# Patient Record
Sex: Female | Born: 1970 | Hispanic: Yes | Marital: Married | Smoking: Never smoker
Health system: Southern US, Community
[De-identification: ages and names within clinical notes are randomized; demographics above are authoritative.]

## PROBLEM LIST (undated history)

## (undated) DIAGNOSIS — E119 Type 2 diabetes mellitus without complications: Secondary | ICD-10-CM

## (undated) HISTORY — PX: APPENDECTOMY: SHX54

---

## 2014-01-13 ENCOUNTER — Inpatient Hospital Stay (HOSPITAL_COMMUNITY): Payer: Medicaid Other

## 2014-01-13 ENCOUNTER — Inpatient Hospital Stay (HOSPITAL_COMMUNITY)
Admission: EM | Admit: 2014-01-13 | Discharge: 2014-01-16 | DRG: 871 | Disposition: A | Discharge status: Home / Self Care | Payer: Medicaid Other | Attending: Internal Medicine | Admitting: Internal Medicine

## 2014-01-13 ENCOUNTER — Other Ambulatory Visit: Payer: Self-pay

## 2014-01-13 ENCOUNTER — Encounter (HOSPITAL_COMMUNITY): Payer: Self-pay | Admitting: Emergency Medicine

## 2014-01-13 ENCOUNTER — Emergency Department (HOSPITAL_COMMUNITY): Payer: Medicaid Other

## 2014-01-13 DIAGNOSIS — Z91199 Patient's noncompliance with other medical treatment and regimen due to unspecified reason: Secondary | ICD-10-CM

## 2014-01-13 DIAGNOSIS — R7881 Bacteremia: Secondary | ICD-10-CM

## 2014-01-13 DIAGNOSIS — D72829 Elevated white blood cell count, unspecified: Secondary | ICD-10-CM | POA: Diagnosis present

## 2014-01-13 DIAGNOSIS — E1110 Type 2 diabetes mellitus with ketoacidosis without coma: Secondary | ICD-10-CM

## 2014-01-13 DIAGNOSIS — E871 Hypo-osmolality and hyponatremia: Secondary | ICD-10-CM | POA: Diagnosis present

## 2014-01-13 DIAGNOSIS — N10 Acute tubulo-interstitial nephritis: Secondary | ICD-10-CM | POA: Diagnosis present

## 2014-01-13 DIAGNOSIS — R6520 Severe sepsis without septic shock: Secondary | ICD-10-CM | POA: Diagnosis present

## 2014-01-13 DIAGNOSIS — N939 Abnormal uterine and vaginal bleeding, unspecified: Secondary | ICD-10-CM | POA: Diagnosis present

## 2014-01-13 DIAGNOSIS — A4151 Sepsis due to Escherichia coli [E. coli]: Secondary | ICD-10-CM

## 2014-01-13 DIAGNOSIS — E876 Hypokalemia: Secondary | ICD-10-CM | POA: Diagnosis present

## 2014-01-13 DIAGNOSIS — E1010 Type 1 diabetes mellitus with ketoacidosis without coma: Secondary | ICD-10-CM | POA: Diagnosis present

## 2014-01-13 DIAGNOSIS — N926 Irregular menstruation, unspecified: Secondary | ICD-10-CM | POA: Diagnosis present

## 2014-01-13 DIAGNOSIS — D649 Anemia, unspecified: Secondary | ICD-10-CM

## 2014-01-13 DIAGNOSIS — D509 Iron deficiency anemia, unspecified: Secondary | ICD-10-CM | POA: Diagnosis present

## 2014-01-13 DIAGNOSIS — R5383 Other fatigue: Secondary | ICD-10-CM | POA: Diagnosis not present

## 2014-01-13 DIAGNOSIS — Z9119 Patient's noncompliance with other medical treatment and regimen: Secondary | ICD-10-CM

## 2014-01-13 DIAGNOSIS — A419 Sepsis, unspecified organism: Secondary | ICD-10-CM | POA: Diagnosis present

## 2014-01-13 DIAGNOSIS — Z23 Encounter for immunization: Secondary | ICD-10-CM | POA: Diagnosis not present

## 2014-01-13 DIAGNOSIS — R5381 Other malaise: Secondary | ICD-10-CM | POA: Diagnosis present

## 2014-01-13 HISTORY — DX: Type 2 diabetes mellitus without complications: E11.9

## 2014-01-13 LAB — RETICULOCYTES
RBC.: 3.73 MIL/uL — ABNORMAL LOW (ref 3.87–5.11)
RETIC CT PCT: 2.2 % (ref 0.4–3.1)
Retic Count, Absolute: 82.1 10*3/uL (ref 19.0–186.0)

## 2014-01-13 LAB — I-STAT ARTERIAL BLOOD GAS, ED
Acid-base deficit: 1 mmol/L (ref 0.0–2.0)
Bicarbonate: 22.1 mEq/L (ref 20.0–24.0)
O2 Saturation: 97 %
PCO2 ART: 30.7 mmHg — AB (ref 35.0–45.0)
PO2 ART: 92 mmHg (ref 80.0–100.0)
Patient temperature: 100.7
TCO2: 23 mmol/L (ref 0–100)
pH, Arterial: 7.47 — ABNORMAL HIGH (ref 7.350–7.450)

## 2014-01-13 LAB — HEPATIC FUNCTION PANEL
ALBUMIN: 2.7 g/dL — AB (ref 3.5–5.2)
ALT: 15 U/L (ref 0–35)
AST: 18 U/L (ref 0–37)
Alkaline Phosphatase: 99 U/L (ref 39–117)
Bilirubin, Direct: <0.2 mg/dL (ref 0.0–0.3)
Total Bilirubin: 0.3 mg/dL (ref 0.3–1.2)
Total Protein: 6.7 g/dL (ref 6.0–8.3)

## 2014-01-13 LAB — URINE MICROSCOPIC-ADD ON

## 2014-01-13 LAB — CBC
HCT: 22.4 % — ABNORMAL LOW (ref 36.0–46.0)
HEMATOCRIT: 25.6 % — AB (ref 36.0–46.0)
HEMOGLOBIN: 6 g/dL — AB (ref 12.0–15.0)
Hemoglobin: 6.8 g/dL — CL (ref 12.0–15.0)
MCH: 15.9 pg — ABNORMAL LOW (ref 26.0–34.0)
MCH: 16.1 pg — AB (ref 26.0–34.0)
MCHC: 26.6 g/dL — AB (ref 30.0–36.0)
MCHC: 26.8 g/dL — ABNORMAL LOW (ref 30.0–36.0)
MCV: 59.7 fL — AB (ref 78.0–100.0)
MCV: 60.1 fL — ABNORMAL LOW (ref 78.0–100.0)
Platelets: 341 10*3/uL (ref 150–400)
Platelets: 304 10*3/uL (ref 150–400)
RBC: 4.29 MIL/uL (ref 3.87–5.11)
RBC: 3.73 MIL/uL — ABNORMAL LOW (ref 3.87–5.11)
RDW: 19.5 % — ABNORMAL HIGH (ref 11.5–15.5)
RDW: 19.4 % — ABNORMAL HIGH (ref 11.5–15.5)
WBC: 9.1 10*3/uL (ref 4.0–10.5)
WBC: 8.3 10*3/uL (ref 4.0–10.5)

## 2014-01-13 LAB — PROTIME-INR
INR: 1.28 (ref 0.00–1.49)
Prothrombin Time: 16 seconds — ABNORMAL HIGH (ref 11.6–15.2)

## 2014-01-13 LAB — URINALYSIS, ROUTINE W REFLEX MICROSCOPIC
Bilirubin Urine: NEGATIVE
Ketones, ur: 15 mg/dL — AB
Nitrite: POSITIVE — AB
PH: 5.5 (ref 5.0–8.0)
Protein, ur: NEGATIVE mg/dL
Specific Gravity, Urine: 1.036 — ABNORMAL HIGH (ref 1.005–1.030)
Urobilinogen, UA: 1 mg/dL (ref 0.0–1.0)

## 2014-01-13 LAB — BASIC METABOLIC PANEL
BUN: 9 mg/dL (ref 6–23)
BUN: 7 mg/dL (ref 6–23)
CHLORIDE: 89 meq/L — AB (ref 96–112)
CHLORIDE: 99 meq/L (ref 96–112)
CO2: 18 mEq/L — ABNORMAL LOW (ref 19–32)
CO2: 19 meq/L (ref 19–32)
Calcium: 9.1 mg/dL (ref 8.4–10.5)
Calcium: 7.9 mg/dL — ABNORMAL LOW (ref 8.4–10.5)
Creatinine, Ser: 0.38 mg/dL — ABNORMAL LOW (ref 0.50–1.10)
Creatinine, Ser: 0.36 mg/dL — ABNORMAL LOW (ref 0.50–1.10)
GFR calc Af Amer: >90 mL/min (ref >90)
GFR calc Af Amer: >90 mL/min (ref >90)
GFR calc non Af Amer: >90 mL/min (ref >90)
GLUCOSE: 389 mg/dL — AB (ref 70–99)
Glucose, Bld: 291 mg/dL — ABNORMAL HIGH (ref 70–99)
Potassium: 4.2 mEq/L (ref 3.7–5.3)
Potassium: 3.4 mEq/L — ABNORMAL LOW (ref 3.7–5.3)
Sodium: 128 mEq/L — ABNORMAL LOW (ref 137–147)
Sodium: 134 mEq/L — ABNORMAL LOW (ref 137–147)

## 2014-01-13 LAB — CBG MONITORING, ED
GLUCOSE-CAPILLARY: 151 mg/dL — AB (ref 70–99)
Glucose-Capillary: 419 mg/dL — ABNORMAL HIGH (ref 70–99)
Glucose-Capillary: 289 mg/dL — ABNORMAL HIGH (ref 70–99)
Glucose-Capillary: 229 mg/dL — ABNORMAL HIGH (ref 70–99)
Glucose-Capillary: 143 mg/dL — ABNORMAL HIGH (ref 70–99)

## 2014-01-13 LAB — GLUCOSE, CAPILLARY
Glucose-Capillary: 177 mg/dL — ABNORMAL HIGH (ref 70–99)
Glucose-Capillary: 179 mg/dL — ABNORMAL HIGH (ref 70–99)
Glucose-Capillary: 189 mg/dL — ABNORMAL HIGH (ref 70–99)

## 2014-01-13 LAB — APTT: aPTT: 31 seconds (ref 24–37)

## 2014-01-13 LAB — IRON AND TIBC: UIBC: 277 ug/dL (ref 125–400)

## 2014-01-13 LAB — I-STAT TROPONIN, ED: Troponin i, poc: 0 ng/mL (ref 0.00–0.08)

## 2014-01-13 LAB — PREPARE RBC (CROSSMATCH)

## 2014-01-13 LAB — FERRITIN: Ferritin: 13 ng/mL (ref 10–291)

## 2014-01-13 LAB — HEMOGLOBIN A1C
Hgb A1c MFr Bld: 9.2 % — ABNORMAL HIGH (ref <5.7)
MEAN PLASMA GLUCOSE: 217 mg/dL — AB (ref <117)

## 2014-01-13 LAB — ABO/RH: ABO/RH(D): O POS

## 2014-01-13 LAB — FOLATE: FOLATE: 17.5 ng/mL

## 2014-01-13 LAB — VITAMIN B12: VITAMIN B 12: 884 pg/mL (ref 211–911)

## 2014-01-13 LAB — POC OCCULT BLOOD, ED: Fecal Occult Bld: NEGATIVE

## 2014-01-13 LAB — I-STAT CG4 LACTIC ACID, ED: Lactic Acid, Venous: 1.2 mmol/L (ref 0.5–2.2)

## 2014-01-13 MED ORDER — ACETAMINOPHEN 325 MG PO TABS
650.0000 mg | ORAL_TABLET | Freq: Once | ORAL | Status: AC
  Administered 2014-01-13: 650 mg via ORAL
  Filled 2014-01-13: qty 2

## 2014-01-13 MED ORDER — DEXTROSE 5 % IV SOLN
1.0000 g | Freq: Two times a day (BID) | INTRAVENOUS | Status: DC
  Administered 2014-01-14 – 2014-01-16 (×5): 1 g via INTRAVENOUS
  Filled 2014-01-13 (×6): qty 1

## 2014-01-13 MED ORDER — DEXTROSE-NACL 5-0.45 % IV SOLN
INTRAVENOUS | Status: DC
  Administered 2014-01-13 – 2014-01-14 (×2): via INTRAVENOUS

## 2014-01-13 MED ORDER — ACETAMINOPHEN 325 MG PO TABS
650.0000 mg | ORAL_TABLET | ORAL | Status: AC
  Administered 2014-01-13: 650 mg via ORAL
  Filled 2014-01-13: qty 2

## 2014-01-13 MED ORDER — SODIUM CHLORIDE 0.9 % IV SOLN
INTRAVENOUS | Status: DC
  Administered 2014-01-13: 2.3 [IU]/h via INTRAVENOUS
  Filled 2014-01-13: qty 1

## 2014-01-13 MED ORDER — ACETAMINOPHEN 325 MG PO TABS
650.0000 mg | ORAL_TABLET | Freq: Four times a day (QID) | ORAL | Status: DC | PRN
  Administered 2014-01-13: 650 mg via ORAL

## 2014-01-13 MED ORDER — GUAIFENESIN-CODEINE 100-10 MG/5ML PO SOLN
5.0000 mL | ORAL | Status: DC | PRN
  Administered 2014-01-15 (×2): 5 mL via ORAL
  Filled 2014-01-13 (×3): qty 5

## 2014-01-13 MED ORDER — DEXTROSE 50 % IV SOLN: 25.0000 mL | INTRAVENOUS | Status: DC | PRN

## 2014-01-13 MED ORDER — DEXTROSE 5 % IV SOLN
1.0000 g | INTRAVENOUS | Status: AC
  Administered 2014-01-13: 1 g via INTRAVENOUS
  Filled 2014-01-13: qty 1

## 2014-01-13 MED ORDER — ACETAMINOPHEN 325 MG PO TABS
ORAL_TABLET | ORAL | Status: AC
  Filled 2014-01-13: qty 2

## 2014-01-13 MED ORDER — SODIUM CHLORIDE 0.9 % IV SOLN
1250.0000 mg | INTRAVENOUS | Status: AC
  Administered 2014-01-13: 1250 mg via INTRAVENOUS
  Filled 2014-01-13: qty 1250

## 2014-01-13 MED ORDER — INSULIN ASPART 100 UNIT/ML ~~LOC~~ SOLN
10.0000 [IU] | Freq: Once | SUBCUTANEOUS | Status: AC
  Administered 2014-01-13: 10 [IU] via SUBCUTANEOUS
  Filled 2014-01-13: qty 1

## 2014-01-13 MED ORDER — VANCOMYCIN HCL IN DEXTROSE 750-5 MG/150ML-% IV SOLN
750.0000 mg | Freq: Two times a day (BID) | INTRAVENOUS | Status: DC
  Administered 2014-01-14 – 2014-01-15 (×3): 750 mg via INTRAVENOUS
  Filled 2014-01-13 (×3): qty 150

## 2014-01-13 MED ORDER — SODIUM CHLORIDE 0.9 % IV BOLUS (SEPSIS)
1000.0000 mL | Freq: Once | INTRAVENOUS | Status: AC
  Administered 2014-01-13: 1000 mL via INTRAVENOUS

## 2014-01-13 MED ORDER — POTASSIUM CHLORIDE 10 MEQ/100ML IV SOLN
10.0000 meq | INTRAVENOUS | Status: AC
  Administered 2014-01-13: 10 meq via INTRAVENOUS
  Filled 2014-01-13: qty 100

## 2014-01-13 MED ORDER — SODIUM CHLORIDE 0.9 % IV SOLN
INTRAVENOUS | Status: AC
  Administered 2014-01-13: 17:00:00 via INTRAVENOUS

## 2014-01-13 MED ORDER — SODIUM CHLORIDE 0.9 % IV BOLUS (SEPSIS)
500.0000 mL | Freq: Once | INTRAVENOUS | Status: AC
  Administered 2014-01-13: 500 mL via INTRAVENOUS

## 2014-01-13 MED ORDER — ACETAMINOPHEN 500 MG PO TABS: 1000.0000 mg | ORAL_TABLET | Freq: Four times a day (QID) | ORAL | Status: DC | PRN

## 2014-01-13 MED ORDER — SODIUM CHLORIDE 0.9 % IV SOLN
INTRAVENOUS | Status: DC
  Administered 2014-01-13: 19:00:00 via INTRAVENOUS

## 2014-01-13 NOTE — Progress Notes (Signed)
Pt admitted to room 3S03. T 102.9 BP 108/57  HR 105 RR 29 SpO2 98% on room air and CBG is 177. Pt is A&O X 4 and denies pain. Pt oriented to room and equipment. NP notified about fever. New orders received. Will carry out orders and continue to monitor closely.

## 2014-01-13 NOTE — ED Notes (Signed)
[facility] [place] [facility]  Daughter [phone]

## 2014-01-13 NOTE — ED Notes (Signed)
Critical Hemoglobin reported to AnetaHayley, [place]

## 2014-01-13 NOTE — ED Notes (Signed)
Dr. Susie CassetteAbrol at the bedside with patient.

## 2014-01-13 NOTE — ED Notes (Signed)
Pt reports for 2 weeks has been feeling weak, and cold. Has diabetes but has been checking sugars for 2 months. CBG 419 today. Denies n/v/d. Hasn't been eating very much. C/o generalized not feeling well all over her body. Chest pressure and left arm pain for several days. Denies cardiac hx .

## 2014-01-13 NOTE — Progress Notes (Signed)
Pt low grade fever earlier during 1st unit blood. No other sx of blood reaction. No rash, SOB, chest pain. RN gave tylenol and continued blood. After 1st unit in, RN paged back secondary to pt with fever [id number]. Pt has been dx with sepsis and has been pan cultured. Will give additional 650mg  Tylenol now and change to 1000mg  po q6h prn (max 4Gms in 24 Hrs). Still without other signs of blood reaction. Other VSS. Instructed RN to let Temp go below 100 F before starting 2nd unit.

## 2014-01-13 NOTE — ED Notes (Signed)
Spoke with Dr. Craige CottaKirby concerning pt temp. Telephone order received for tylenol and to restart blood back.

## 2014-01-13 NOTE — ED Notes (Signed)
Attempted report 

## 2014-01-13 NOTE — H&P (Addendum)
[facility] History and Physical  Nancy Guerra WUJ:[id number] DOB: 1971/03/04 DOA: 01/13/2014  Referring physician: ER  PCP: No primary provider on file.   Chief Complaint: Weakness  HPI:  43 year old female, diabetic presents to the ER because of generalized weakness and fatigue progressively worsening over the past 2 weeks.,and  cough for the last couple of days. The patient is a diabetic but has been noncompliant for the last 3-4 years with her oral hypoglycemics. She does not recall any fever at home but was found to have a fever of 100.7 in the ED. Initially tachycardic with a heart rate of 128, systolic blood pressure of 107. Yesterday she took Advil and started  sweating after that. The patient was found to be fairly anemic with a hemoglobin of 6.8. CBg was greater than 400. She also had one episode of chest pressure this morning, without any associated shortness of breath. She did complain of some lightheadedness and fatigue which has been progressively getting worse over the last 2 weeks. Denies any loss of consciousness or syncope. She also has a mild headache for the last couple of days. She was guaiac negative in the ER. She denies any heavy menstrual periods, denies any history of fibroids. She was told several years ago by her primary care provider that she was anemic. She denies any polyuria or polydipsia. She has had loss of appetite for the last couple of days. [facility] [place]. When questioned, the patient states that 3 weeks ago the patient had made a trip to the mountains, and the beach. She did go hiking in the mountains with her family. She does not remember any tick bites or skin rashes.      Review of Systems: negative for the following  Constitutional: Negative for fever.  Eyes: Negative for visual disturbance.  Respiratory: Positive for cough.  Cardiovascular: Positive for chest pain.   Gastrointestinal: Negative for nausea, vomiting, abdominal pain and diarrhea.  Musculoskeletal: Positive for myalgias.  Neurological: Positive for weakness and headaches.  Psychiatric/Behavioral: Negative for confusion.  Hematological: Denies adenopathy. Easy bruising, personal or family bleeding history  Psychiatric/Behavioral: Denies suicidal ideation, mood changes, confusion, nervousness, sleep disturbance and agitation       Past Medical History  Diagnosis Date  . Diabetes mellitus without complication      Past Surgical History  Procedure Laterality Date  . Appendectomy        Social History:  reports that she has never smoked. She does not have any smokeless tobacco history on file. She reports that she does not drink alcohol. Her drug history is not on file.    No Known Allergies  No family history on file.   Prior to Admission medications   Medication Sig Start Date End Date Taking? Authorizing Provider  Acetaminophen (TYLENOL PO) Take 2 tablets by mouth daily as needed (for pain or headache).   Yes Historical Provider, MD  Emollient (NIVEA) cream Apply 1 application topically as needed for dry skin.   Yes Historical Provider, MD     Physical Exam: Filed Vitals:   01/13/14 1255 01/13/14 1400 01/13/14 1442 01/13/14 1515  BP: 146/70 132/66 120/64 110/57  Pulse: 128 115 103 99  Temp: 100.7 F (38.2 C)     TempSrc: Oral     Resp: 24   25  SpO2: 100% 96% 97% 96%     Constitutional: Vital signs reviewed. Patient is a well-developed and well-nourished  in no acute distress and cooperative with exam. Alert and oriented x3.  Head: Normocephalic and atraumatic  Ear: TM normal bilaterally  Mouth: no erythema or exudates, MMM  Eyes: PERRL, EOMI, conjunctivae normal, No scleral icterus.  Neck: Supple, Trachea midline normal ROM, No JVD, mass, thyromegaly, or carotid bruit present.  Cardiovascular: RRR, S1 normal, S2 normal, no MRG, pulses symmetric and intact  bilaterally  Pulmonary/Chest: CTAB, no wheezes, rales, or rhonchi  Abdominal: Soft. Non-tender, non-distended, bowel sounds are normal, no masses, organomegaly, or guarding present.  GU: no CVA tenderness Musculoskeletal: No joint deformities, erythema, or stiffness, ROM full and no nontender Ext: no edema and no cyanosis, pulses palpable bilaterally (DP and PT)  Hematology: no cervical, inginal, or axillary adenopathy.  Neurological: A&O x3, Strenght is normal and symmetric bilaterally, cranial nerve II-XII are grossly intact, no focal motor deficit, sensory intact to light touch bilaterally.  Skin: Warm, dry and intact. No rash, cyanosis, or clubbing.  Psychiatric: Normal mood and affect. speech and behavior is normal. Judgment and thought content normal. Cognition and memory are normal.       Labs on Admission:    Basic Metabolic Panel:  Recent Labs Lab 01/13/14 1330  NA 128*  K 4.2  CL 89*  CO2 18*  GLUCOSE 389*  BUN 9  CREATININE 0.38*  CALCIUM 9.1   Liver Function Tests: No results found for this basename: AST, ALT, ALKPHOS, BILITOT, PROT, ALBUMIN,  in the last 168 hours No results found for this basename: LIPASE, AMYLASE,  in the last 168 hours No results found for this basename: AMMONIA,  in the last 168 hours CBC:  Recent Labs Lab 01/13/14 1345  WBC 9.1  HGB 6.8*  HCT 25.6*  MCV 59.7*  PLT 341   Cardiac Enzymes: No results found for this basename: CKTOTAL, CKMB, CKMBINDEX, TROPONINI,  in the last 168 hours  BNP (last 3 results) No results found for this basename: PROBNP,  in the last 8760 hours    CBG:  Recent Labs Lab 01/13/14 1300  GLUCAP 419*    Radiological Exams on Admission: Dg Chest 2 View  01/13/2014   CLINICAL DATA:  Fever weakness and dry cough for 3 months  EXAM: CHEST  2 VIEW  COMPARISON:  None.  FINDINGS: The heart size and mediastinal contours are within normal limits. Both lungs are clear. The visualized skeletal structures are  unremarkable.  IMPRESSION: No active cardiopulmonary disease.   Electronically Signed   By: Esperanza Heiraymond  Rubner M.D.   On: 01/13/2014 15:10    EKG: Independently reviewed.   Assessment/Plan Active Problems:   DKA (diabetic ketoacidoses)   Anemia   Generalized weakness Multifactorial in the setting of infection, anemia, DKA Patient will be admitted to step down because of the above reasons   Diabetic ketoacidosis, likely precipitated by underlying infection Anion gap of 21 ABG shows the patient is well compensated The patient is tender and start the patient on DKA protocol Serial BMP, aggressive IV hydration History of noncompliance therefore the patient would benefit from a diabetic coordinator consult Check hemoglobin A1c  Fever/probable sepsis Urinalysis shows a mild urinary tract infection with 11-20 WBCs, trace amount of the leukocyte esterase, positive nitrite, chest x-ray negative Start the patient on broad-spectrum antibiotics, vancomycin cefepime Aggressive hydration Blood cultures x2 have been drawn, follow blood culture Given that the patient cough she could have an underlying viral bronchitis  Anemia FOBT negative Pelvic ultrasound does not show any fibroids Anemia panel ordered and  pending Transfusing 2 units of packed blood cells    Code Status:   full Family Communication: bedside Disposition Plan: admit   Time spent: 70 mins   [facility] Pager [phone]  If 7PM-7AM, please contact night-coverage [email or web address] Password [email or web address] 01/13/2014, 5:04 PM

## 2014-01-13 NOTE — ED Provider Notes (Signed)
CSN: [id number]     Arrival date & time 01/13/14  1252 History   First MD Initiated Contact with Patient 01/13/14 1355     Chief Complaint  Patient presents with  . Weakness  . Chest Pain  . Hyperglycemia  . Cough     (Consider location/radiation/quality/duration/timing/severity/associated sxs/prior Treatment) Patient is a 43 y.o. female presenting with weakness. The history is provided by the patient. A language interpreter was used.  Weakness This is a new problem. The current episode started 1 to 4 weeks ago. The problem occurs constantly. The problem has been gradually worsening. Associated symptoms include chest pain, coughing, headaches, myalgias and weakness. Pertinent negatives include no abdominal pain, fever, nausea or vomiting. Associated symptoms comments: The patient reports generalized weakness and fatigue progressively worsening over the past 2 weeks. No fever. She has generalized muscle aching but also reports chest pressure that is constant. No nausea or vomiting. No diarrhea. She is a diabetic on oral medications but reports she does not take her medications because they make her tired and sleep all the time. She also has no meter currently and has not checked her blood sugar in over 3 months. No syncope..    Past Medical History  Diagnosis Date  . Diabetes mellitus without complication    Past Surgical History  Procedure Laterality Date  . Appendectomy     No family history on file. History  Substance Use Topics  . Smoking status: Never Smoker   . Smokeless tobacco: Not on file  . Alcohol Use: No   OB History   Grav Para Term Preterm Abortions TAB SAB Ect Mult Living                 Review of Systems  Constitutional: Negative for fever.  Eyes: Negative for visual disturbance.  Respiratory: Positive for cough.   Cardiovascular: Positive for chest pain.  Gastrointestinal: Negative for nausea, vomiting, abdominal pain and diarrhea.  Musculoskeletal:  Positive for myalgias.  Neurological: Positive for weakness and headaches.  Psychiatric/Behavioral: Negative for confusion.      Allergies  Review of patient's allergies indicates no known allergies.  Home Medications   Prior to Admission medications   Not on File   BP 132/66  Pulse 115  Temp(Src) 100.7 F (38.2 C) (Oral)  Resp 24  SpO2 96%  LMP 12/30/2013 Physical Exam  Constitutional: She is oriented to person, place, and time. She appears well-developed and well-nourished.  HENT:  Head: Normocephalic.  Neck: Normal range of motion. Neck supple.  Cardiovascular: Regular rhythm.  Tachycardia present.   Pulmonary/Chest: Effort normal and breath sounds normal. She has no wheezes. She has no rales.  Abdominal: Soft. Bowel sounds are normal. There is no tenderness. There is no rebound and no guarding.  Musculoskeletal: Normal range of motion. She exhibits no edema.  Neurological: She is alert and oriented to person, place, and time.  Skin: Skin is warm and dry. No rash noted.  Psychiatric: She has a normal mood and affect.    ED Course  Procedures (including critical care time) Labs Review Labs Reviewed  CBC - Abnormal; Notable for the following:    Hemoglobin 6.8 (*)    HCT 25.6 (*)    MCV 59.7 (*)    MCH 15.9 (*)    MCHC 26.6 (*)    RDW 19.5 (*)    All other components within normal limits  CBG MONITORING, ED - Abnormal; Notable for the following:    Glucose-Capillary 419 (*)  All other components within normal limits  BASIC METABOLIC PANEL  URINALYSIS, ROUTINE W REFLEX MICROSCOPIC  I-STAT TROPOININ, ED  I-STAT CG4 LACTIC ACID, ED  TYPE AND SCREEN   CRITICAL CARE Performed by: Elpidio AnisUPSTILL, SHARI A   Total critical care time: 45  Critical care time was exclusive of separately billable procedures and treating other patients.  Critical care was necessary to treat or prevent imminent or life-threatening deterioration.  Critical care was time spent  personally by me on the following activities: development of treatment plan with patient and/or surrogate as well as nursing, discussions with consultants, evaluation of patient's response to treatment, examination of patient, obtaining history from patient or surrogate, ordering and performing treatments and interventions, ordering and review of laboratory studies, ordering and review of radiographic studies, pulse oximetry and re-evaluation of patient's condition.  Imaging Review No results found.   EKG Interpretation None      MDM   Final diagnoses:  None    1. Symptomatic anemia 2. DKA 3. Medication noncompliance  The patient is found to be significantly anemic. Daughter is no longer in the room and interpreter was used for clarification of history and to explain results of evaluation and care plan. She denies any history of anemia in the past or need for transfusion. She denies melena or hematemesis. She reports her menses are usually very light, lasting one day. She does admit to not taking her diabetic medications as prescribed but denies history of DKA. Transfusion orders begun. [facility] paged for admission and patient is accepted by Dr. Lodema PilotArbrol.   She remains tachycardic despite fluid boluses. Glucostabilizer orders entered. The patient is guaiac negative. She is alert and NAD. She acknowledges understanding of care plan and agrees. Consent for transfusion done with aid of phone interpreter.     Arnoldo HookerShari A Upstill, PA-C 01/22/14 [phone]

## 2014-01-13 NOTE — Progress Notes (Addendum)
ANTIBIOTIC CONSULT NOTE - INITIAL  Pharmacy Consult for Vancomycin, Cefepime Indication: rule out sepsis  No Known Allergies  Patient Measurements:  No current weight available- estimated ~140lbs (63.5 kg)  Vital Signs: Temp: 100.7 F (38.2 C) (06/30 1255) Temp src: Oral (06/30 1255) BP: 110/57 mmHg (06/30 1515) Pulse Rate: 99 (06/30 1515) Intake/Output from previous day:   Intake/Output from this shift:    Labs:  Recent Labs  01/13/14 1330 01/13/14 1345  WBC  --  9.1  HGB  --  6.8*  PLT  --  341  CREATININE 0.38*  --    CrCl is unknown because there is no height on file for the current visit. No results found for this basename: VANCOTROUGH, VANCOPEAK, VANCORANDOM, GENTTROUGH, GENTPEAK, GENTRANDOM, TOBRATROUGH, TOBRAPEAK, TOBRARND, AMIKACINPEAK, AMIKACINTROU, AMIKACIN,  in the last 72 hours   Microbiology: No results found for this or any previous visit (from the past 720 hour(s)).  Medical History: Past Medical History  Diagnosis Date  . Diabetes mellitus without complication     Assessment: 43 yo diabetic female with 2 week history of feeling weak and cold, poor appetite with temperature of 100.7, RR 24, HR 115 to start IV vancomycin and cefepime for r/o sepsis. SCr 0.38 (low- may be decreased muscle mass). WBC 9.1 -within normal limits. Blood cultures have been sent. No antibiotics prior to admission noted.   Goal of Therapy:  Vancomycin trough level 15-20 mcg/ml Clinical resolution of infection  Plan:  1. Cefepime 1g STAT.  2. Vancomycin 1250mg  IV x1 now.  3. Verify patients weight and height for further dosing.  Link SnufferJessica Millen, PharmD, BCPS Clinical Pharmacist [phone] 01/13/2014,3:41 PM   Addendum: CrCl ~ 81 mL/min. Blood Cx x2 pending   1) Start Vancomycin 750 mg IV Q 8 hours  2) Start Cefepime 1 gm IV Q 12 hours  3) Monitor CBC, renal fx, cultures, and patient's clinical progress  4) Vancomycin trough at steady state   Vinnie LevelBenjamin Sharlena Kristensen,  PharmD.  Clinical Pharmacist Pager [phone]

## 2014-01-13 NOTE — ED Notes (Signed)
CBG 229 

## 2014-01-13 NOTE — ED Provider Notes (Signed)
History is obtained from patient and from patient's family. Interpreter offered which he declines. Patient complained of generalized weakness and anterior chest pressure earlier today. This improved presently. She is presently asymptomatic on exam no distress lungs clear auscultation heart mildly tachycardic regular rhythm abdomen nondistended nontender extremities without redness swelling or tenderness neurovascularly intact Results for orders placed during the hospital encounter of 01/13/14  CBC      Result Value Ref Range   WBC 9.1  4.0 - 10.5 K/uL   RBC 4.29  3.87 - 5.11 MIL/uL   Hemoglobin 6.8 (*) 12.0 - 15.0 g/dL   HCT 16.125.6 (*) 09.636.0 - 04.546.0 %   MCV 59.7 (*) 78.0 - 100.0 fL   MCH 15.9 (*) 26.0 - 34.0 pg   MCHC 26.6 (*) 30.0 - 36.0 g/dL   RDW 40.919.5 (*) 81.111.5 - 91.415.5 %   Platelets 341  150 - 400 K/uL  BASIC METABOLIC PANEL      Result Value Ref Range   Sodium 128 (*) 137 - 147 mEq/L   Potassium 4.2  3.7 - 5.3 mEq/L   Chloride 89 (*) 96 - 112 mEq/L   CO2 18 (*) 19 - 32 mEq/L   Glucose, Bld 389 (*) 70 - 99 mg/dL   BUN 9  6 - 23 mg/dL   Creatinine, Ser 7.820.38 (*) 0.50 - 1.10 mg/dL   Calcium 9.1  8.4 - 95.610.5 mg/dL   GFR calc non Af Amer >90  >90 mL/min   GFR calc Af Amer >90  >90 mL/min  URINALYSIS, ROUTINE W REFLEX MICROSCOPIC      Result Value Ref Range   Color, Urine YELLOW  YELLOW   APPearance CLOUDY (*) CLEAR   Specific Gravity, Urine 1.036 (*) 1.005 - 1.030   pH 5.5  5.0 - 8.0   Glucose, UA >1000 (*) NEGATIVE mg/dL   Hgb urine dipstick TRACE (*) NEGATIVE   Bilirubin Urine NEGATIVE  NEGATIVE   Ketones, ur 15 (*) NEGATIVE mg/dL   Protein, ur NEGATIVE  NEGATIVE mg/dL   Urobilinogen, UA 1.0  0.0 - 1.0 mg/dL   Nitrite POSITIVE (*) NEGATIVE   Leukocytes, UA SMALL (*) NEGATIVE  APTT      Result Value Ref Range   aPTT 31  24 - 37 seconds  PROTIME-INR      Result Value Ref Range   Prothrombin Time 16.0 (*) 11.6 - 15.2 seconds   INR 1.28  0.00 - 1.49  URINE MICROSCOPIC-ADD ON   Result Value Ref Range   Squamous Epithelial / LPF FEW (*) RARE   WBC, UA 11-20  <3 WBC/hpf   Bacteria, UA MANY (*) RARE  CBG MONITORING, ED      Result Value Ref Range   Glucose-Capillary 419 (*) 70 - 99 mg/dL  I-STAT TROPOININ, ED      Result Value Ref Range   Troponin i, poc 0.00  0.00 - 0.08 ng/mL   Comment 3           I-STAT CG4 LACTIC ACID, ED      Result Value Ref Range   Lactic Acid, Venous 1.20  0.5 - 2.2 mmol/L  I-STAT ARTERIAL BLOOD GAS, ED      Result Value Ref Range   pH, Arterial 7.470 (*) 7.350 - 7.450   pCO2 arterial 30.7 (*) 35.0 - 45.0 mmHg   pO2, Arterial 92.0  80.0 - 100.0 mmHg   Bicarbonate 22.1  20.0 - 24.0 mEq/L   TCO2 23  0 -  100 mmol/L   O2 Saturation 97.0     Acid-base deficit 1.0  0.0 - 2.0 mmol/L   Patient temperature 100.7 F     Collection site RADIAL, ALLEN'S TEST ACCEPTABLE     Drawn by Operator     Sample type ARTERIAL    POC OCCULT BLOOD, ED      Result Value Ref Range   Fecal Occult Bld NEGATIVE  NEGATIVE  TYPE AND SCREEN      Result Value Ref Range   ABO/RH(D) O POS     Antibody Screen NEG     Sample Expiration 01/16/2014     Unit Number [id number]     Blood Component Type RED CELLS,LR     Unit division 00     Status of Unit ALLOCATED     Transfusion Status OK TO TRANSFUSE     Crossmatch Result Compatible     Unit Number [id number]     Blood Component Type RED CELLS,LR     Unit division 00     Status of Unit ALLOCATED     Transfusion Status OK TO TRANSFUSE     Crossmatch Result Compatible    PREPARE RBC (CROSSMATCH)      Result Value Ref Range   Order Confirmation ORDER PROCESSED BY BLOOD BANK    ABO/RH      Result Value Ref Range   ABO/RH(D) O POS     Dg Chest 2 View  01/13/2014   CLINICAL DATA:  Fever weakness and dry cough for 3 months  EXAM: CHEST  2 VIEW  COMPARISON:  None.  FINDINGS: The heart size and mediastinal contours are within normal limits. Both lungs are clear. The visualized skeletal structures are  unremarkable.  IMPRESSION: No active cardiopulmonary disease.   Electronically Signed   By: Esperanza Heiraymond  Rubner M.D.   On: 01/13/2014 15:10    Date: 01/13/2014  Rate: 115  Rhythm: sinus tachycardia  QRS Axis: left  Intervals: normal  ST/T Wave abnormalities: nonspecific T wave changes  Conduction Disutrbances:none  Narrative Interpretation:   Old EKG Reviewed: No prior EKG available   Doug SouSam Laney Louderback, MD 01/13/14 1705

## 2014-01-13 NOTE — ED Notes (Signed)
Paged [facility] to [phone] 

## 2014-01-13 NOTE — ED Notes (Signed)
Consent for blood signed by patient and witnessed by PA. Pt going to u/s at this time.

## 2014-01-14 DIAGNOSIS — R6520 Severe sepsis without septic shock: Secondary | ICD-10-CM

## 2014-01-14 DIAGNOSIS — D72829 Elevated white blood cell count, unspecified: Secondary | ICD-10-CM

## 2014-01-14 DIAGNOSIS — A419 Sepsis, unspecified organism: Secondary | ICD-10-CM

## 2014-01-14 LAB — BASIC METABOLIC PANEL
Anion gap: 14 (ref 5–15)
BUN: 3 mg/dL — AB (ref 6–23)
BUN: 3 mg/dL — ABNORMAL LOW (ref 6–23)
BUN: 4 mg/dL — ABNORMAL LOW (ref 6–23)
BUN: <3 mg/dL — ABNORMAL LOW (ref 6–23)
CALCIUM: 7.8 mg/dL — AB (ref 8.4–10.5)
CO2: 20 mEq/L (ref 19–32)
CO2: 21 mEq/L (ref 19–32)
CO2: 21 mEq/L (ref 19–32)
CO2: 22 mEq/L (ref 19–32)
CREATININE: 0.33 mg/dL — AB (ref 0.50–1.10)
CREATININE: 0.4 mg/dL — AB (ref 0.50–1.10)
Calcium: 7.8 mg/dL — ABNORMAL LOW (ref 8.4–10.5)
Calcium: 7.9 mg/dL — ABNORMAL LOW (ref 8.4–10.5)
Calcium: 8.4 mg/dL (ref 8.4–10.5)
Chloride: 101 mEq/L (ref 96–112)
Chloride: 102 mEq/L (ref 96–112)
Chloride: 102 mEq/L (ref 96–112)
Chloride: 99 mEq/L (ref 96–112)
Creatinine, Ser: 0.36 mg/dL — ABNORMAL LOW (ref 0.50–1.10)
Creatinine, Ser: 0.41 mg/dL — ABNORMAL LOW (ref 0.50–1.10)
GFR calc Af Amer: >90 mL/min (ref >90)
GLUCOSE: 206 mg/dL — AB (ref 70–99)
Glucose, Bld: 201 mg/dL — ABNORMAL HIGH (ref 70–99)
Glucose, Bld: 185 mg/dL — ABNORMAL HIGH (ref 70–99)
Glucose, Bld: 183 mg/dL — ABNORMAL HIGH (ref 70–99)
Potassium: 3 mEq/L — ABNORMAL LOW (ref 3.7–5.3)
Potassium: 3.6 mEq/L — ABNORMAL LOW (ref 3.7–5.3)
Potassium: 3.6 mEq/L — ABNORMAL LOW (ref 3.7–5.3)
Potassium: 3.7 mEq/L (ref 3.7–5.3)
SODIUM: 136 meq/L — AB (ref 137–147)
Sodium: 137 mEq/L (ref 137–147)
Sodium: 135 mEq/L — ABNORMAL LOW (ref 137–147)
Sodium: 135 mEq/L — ABNORMAL LOW (ref 137–147)

## 2014-01-14 LAB — CBC
HCT: 31.3 % — ABNORMAL LOW (ref 36.0–46.0)
Hemoglobin: 9 g/dL — ABNORMAL LOW (ref 12.0–15.0)
MCH: 18.9 pg — ABNORMAL LOW (ref 26.0–34.0)
MCHC: 28.8 g/dL — ABNORMAL LOW (ref 30.0–36.0)
MCV: 65.9 fL — ABNORMAL LOW (ref 78.0–100.0)
Platelets: 298 10*3/uL (ref 150–400)
RBC: 4.75 MIL/uL (ref 3.87–5.11)
RDW: 24 % — AB (ref 11.5–15.5)
WBC: 11.2 10*3/uL — AB (ref 4.0–10.5)

## 2014-01-14 LAB — GLUCOSE, CAPILLARY
GLUCOSE-CAPILLARY: 107 mg/dL — AB (ref 70–99)
GLUCOSE-CAPILLARY: 120 mg/dL — AB (ref 70–99)
GLUCOSE-CAPILLARY: 138 mg/dL — AB (ref 70–99)
GLUCOSE-CAPILLARY: 146 mg/dL — AB (ref 70–99)
GLUCOSE-CAPILLARY: 171 mg/dL — AB (ref 70–99)
GLUCOSE-CAPILLARY: 190 mg/dL — AB (ref 70–99)
GLUCOSE-CAPILLARY: 205 mg/dL — AB (ref 70–99)
GLUCOSE-CAPILLARY: 212 mg/dL — AB (ref 70–99)
Glucose-Capillary: 148 mg/dL — ABNORMAL HIGH (ref 70–99)
Glucose-Capillary: 207 mg/dL — ABNORMAL HIGH (ref 70–99)
Glucose-Capillary: 161 mg/dL — ABNORMAL HIGH (ref 70–99)
Glucose-Capillary: 201 mg/dL — ABNORMAL HIGH (ref 70–99)
Glucose-Capillary: 168 mg/dL — ABNORMAL HIGH (ref 70–99)
Glucose-Capillary: 177 mg/dL — ABNORMAL HIGH (ref 70–99)

## 2014-01-14 LAB — TSH: TSH: 2.28 u[IU]/mL (ref 0.350–4.500)

## 2014-01-14 LAB — MRSA PCR SCREENING: MRSA by PCR: NEGATIVE

## 2014-01-14 MED ORDER — INSULIN STARTER KIT- SYRINGES (SPANISH)
1.0000 | Freq: Once | Status: AC
  Administered 2014-01-14: 1
  Filled 2014-01-14: qty 1

## 2014-01-14 MED ORDER — PNEUMOCOCCAL VAC POLYVALENT 25 MCG/0.5ML IJ INJ
0.5000 mL | INJECTION | INTRAMUSCULAR | Status: AC
  Administered 2014-01-15: 0.5 mL via INTRAMUSCULAR
  Filled 2014-01-14: qty 0.5

## 2014-01-14 MED ORDER — POTASSIUM CHLORIDE 10 MEQ/100ML IV SOLN
10.0000 meq | INTRAVENOUS | Status: AC
  Administered 2014-01-14 (×4): 10 meq via INTRAVENOUS
  Filled 2014-01-14 (×4): qty 100

## 2014-01-14 MED ORDER — LIVING WELL WITH DIABETES BOOK - IN SPANISH
Freq: Once | Status: AC
  Administered 2014-01-14: 18:00:00
  Filled 2014-01-14 (×2): qty 1

## 2014-01-14 MED ORDER — INSULIN ASPART 100 UNIT/ML ~~LOC~~ SOLN: 0.0000 [IU] | Freq: Every day | SUBCUTANEOUS | Status: DC

## 2014-01-14 MED ORDER — INSULIN ASPART 100 UNIT/ML ~~LOC~~ SOLN
0.0000 [IU] | Freq: Three times a day (TID) | SUBCUTANEOUS | Status: DC
  Administered 2014-01-14: 5 [IU] via SUBCUTANEOUS
  Administered 2014-01-14: 3 [IU] via SUBCUTANEOUS
  Administered 2014-01-15: 5 [IU] via SUBCUTANEOUS
  Administered 2014-01-15: 3 [IU] via SUBCUTANEOUS
  Administered 2014-01-15: 5 [IU] via SUBCUTANEOUS
  Administered 2014-01-16: 3 [IU] via SUBCUTANEOUS

## 2014-01-14 MED ORDER — FERROUS SULFATE 325 (65 FE) MG PO TABS
325.0000 mg | ORAL_TABLET | Freq: Three times a day (TID) | ORAL | Status: DC
  Administered 2014-01-14 – 2014-01-16 (×6): 325 mg via ORAL
  Filled 2014-01-14 (×9): qty 1

## 2014-01-14 MED ORDER — INSULIN GLARGINE 100 UNIT/ML ~~LOC~~ SOLN
15.0000 [IU] | Freq: Once | SUBCUTANEOUS | Status: AC
  Administered 2014-01-14: 15 [IU] via SUBCUTANEOUS
  Filled 2014-01-14: qty 0.15

## 2014-01-14 NOTE — Progress Notes (Signed)
Did ask patient with interpreter if she could afford $25 a month for 70/30 [employer] brand insulin.  She said it would be very difficult.  May need to be [facility] with insulin for a [facility].  Smith MinceKendra Cleota Pellerito RN BSN CDE

## 2014-01-14 NOTE — Progress Notes (Signed)
UR Completed Draylen Lobue Graves-Bigelow, RN,BSN [phone]  

## 2014-01-14 NOTE — Evaluation (Signed)
Physical Therapy Evaluation Patient Details Name: Nancy Guerra MRN: [id number] DOB: [birth year] Today's Date: 01/14/2014   History of Present Illness  Pt admit with DKA.    Clinical Impression  Pt admitted with above. Pt currently with functional limitations due to the deficits listed below (see PT Problem List).  Pt will benefit from skilled PT to increase their independence and safety with mobility to allow discharge to the venue listed below.     Follow Up Recommendations No PT follow up;Supervision - Intermittent    Equipment Recommendations  None recommended by PT    Recommendations for Other Services       Precautions / Restrictions Precautions Precautions: None Restrictions Weight Bearing Restrictions: No      Mobility  Bed Mobility Overal bed mobility: Independent                Transfers Overall transfer level: Independent                  Ambulation/Gait Ambulation/Gait assistance: Min guard Ambulation Distance (Feet): 350 Feet Assistive device: None Gait Pattern/deviations: Step-through pattern;Decreased stride length   Gait velocity interpretation: <1.8 ft/sec, indicative of risk for recurrent falls General Gait Details: Pt initially slightly unsteady questionably due to inactivity last day.  As pt ambulated, steadiness increased and pt could tolerate min challenges to balance.    Stairs            Wheelchair Mobility    Modified Rankin (Stroke Patients Only)       Balance Overall balance assessment: Needs assistance;History of Falls Sitting-balance support: No upper extremity supported;Feet supported Sitting balance-Leahy Scale: Good     Standing balance support: No upper extremity supported;During functional activity Standing balance-Leahy Scale: Fair               High level balance activites: Turns;Head turns;Direction changes High Level Balance Comments: all of above with min guard assist.               Pertinent Vitals/Pain VSS, no pain    Home Living Family/patient expects to be discharged to:: Private residence Living Arrangements: Spouse/significant other Available Help at Discharge: Family;Available PRN/intermittently Type of Home: House Home Access: Stairs to enter Entrance Stairs-Rails: Right Entrance Stairs-Number of Steps: 8 Home Layout: One level Home Equipment: None      Prior Function Level of Independence: Independent               Hand Dominance        Extremity/Trunk Assessment   Upper Extremity Assessment: Defer to OT evaluation           Lower Extremity Assessment: Generalized weakness      Cervical / Trunk Assessment: Normal  Communication   Communication: Prefers language other than English (Spanish speaking)  Cognition Arousal/Alertness: Awake/alert Behavior During Therapy: WFL for tasks assessed/performed Overall Cognitive Status: Within Functional Limits for tasks assessed                      General Comments      Exercises General Exercises - Lower Extremity Ankle Circles/Pumps: AROM;Both;10 reps;Supine Long Arc Quad: AROM;Both;10 reps;Seated      Assessment/Plan    PT Assessment Patient needs continued PT services  PT Diagnosis Generalized weakness   PT Problem List Decreased mobility;Decreased balance;Decreased activity tolerance;Decreased knowledge of use of DME;Decreased safety awareness;Decreased knowledge of precautions  PT Treatment Interventions DME instruction;Gait training;Functional mobility training;Stair training;Therapeutic activities;Therapeutic exercise;Balance training;Patient/family education   PT Goals (  Current goals can be found in the Care Plan section) Acute Rehab PT Goals Patient Stated Goal: to go home PT Goal Formulation: With patient Time For Goal Achievement: 01/21/14 Potential to Achieve Goals: Good    Frequency Min 3X/week   Barriers to discharge Decreased caregiver  support      Co-evaluation               End of Session Equipment Utilized During Treatment: Gait belt Activity Tolerance: Patient limited by fatigue Patient left: in chair;with call bell/phone within reach Nurse Communication: Mobility status         Time: [id number] PT Time Calculation (min): 14 min   Charges:   PT Evaluation $Initial PT Evaluation Tier I: 1 Procedure PT Treatments $Gait Training: 8-22 mins   PT G Codes:          INGOLD,Rawn Quiroa 01/14/2014, 1:25 PM [facility],PT Acute Rehabilitation [phone] (pager)

## 2014-01-14 NOTE — Progress Notes (Addendum)
[facility] PROGRESS NOTE  Nancy LimesVeronica Guerra Guerra ONG:[id number] DOB: 26-Apr-1971 DOA: 01/13/2014 PCP: Pcp Not In System  Assessment/Plan  Generalized weakness, likely multifactorial due to infection, anemia, DKA  -  PT eval  Diabetic ketoacidosis, likely precipitated by underlying infection, AG 15, bicarb >= 20 x 12 hours. -  A1c 9.2 -  Transition to subcutaneous insulin.  Will likely need about 50 units Total daily insulin.  Will give 15 units lantus with mod dose SSI for now and adjust prn -  Start diet -  Repeat BMP 4 hours after insulin gtt discontinued  Fever/probable sepsis due to UTI/pyelonephritis,  -  F/u urine culture -  BCx NGTD -  Continue vancomycin + cefepime day 1 -  Plan to d/c vancomycin tomorrow if cultures negative.  Iron deficiency anemia, possibly due to uterine bleeding.  Vag US without explanation for heavy bleeding.  Occult stool neg -  S/p 2 units PRBC -  Start iron supplementation TID -  Needs GYN and possibly GI follow up to determine source of blood loss and correct  Leukocytosis, likely reactive from DKA and underlying infection.  Rising, but fever resolving -  Repeat in AM  Diet:  diabetic Access:  PIV IVF:  off Proph:  lovenox  Code Status: full Family Communication: patient alone Disposition Plan: transfer to med-surg once stable off insulin gtt    Consultants:  None  Procedures:  CXR  Pelvic/transvaginal US  Antibiotics:  Vanc from 6/30  Cefepime from 7/1   HPI/Subjective:  Poor appetite.  Has some back pain for last few hours, but otherwise feeling okay.  Denies nausea.  Objective: Filed Vitals:   01/14/14 0145 01/14/14 0200 01/14/14 0205 01/14/14 0308  BP:  98/59 92/57 103/65  Pulse: 70 70 71 72  Temp:   98 F (36.7 C) 98.3 F (36.8 C)  TempSrc:   Oral Oral  Resp: 20 21 24 21   Height:      Weight:      SpO2: 98% 98% 98% 100%    Intake/Output Summary (Last 24 hours) at 01/14/14 0755 Last data filed at  01/14/14 0500  Gross per 24 hour  Intake 5187.5 ml  Output   1000 ml  Net 4187.5 ml   Filed Weights   01/13/14 2108  Weight: 65.5 kg (144 lb 6.4 oz)    Exam:   General:  hispanic F, No acute distress  HEENT:  NCAT, MMM  Cardiovascular:  RRR, nl S1, S2 no mrg, 2+ pulses, warm extremities  Respiratory:  CTAB, no increased WOB  Abdomen:   NABS, soft, NT/ND  MSK:   Normal tone and bulk, no LEE  Neuro:  Grossly intact  Data Reviewed: Basic Metabolic Panel:  Recent Labs Lab 01/13/14 1330 01/13/14 1648 01/13/14 2359 01/14/14 0650  NA 128* 134* 136* 137  K 4.2 3.4* 3.0* 3.7  CL 89* 99 101 102  CO2 18* 19 21 20   GLUCOSE 389* 291* 206* 201*  BUN 9 7 4* 3*  CREATININE 0.38* 0.36* 0.41* 0.33*  CALCIUM 9.1 7.9* 7.9* 7.8*   Liver Function Tests:  Recent Labs Lab 01/13/14 1648  AST 18  ALT 15  ALKPHOS 99  BILITOT 0.3  PROT 6.7  ALBUMIN 2.7*   No results found for this basename: LIPASE, AMYLASE,  in the last 168 hours No results found for this basename: AMMONIA,  in the last 168 hours CBC:  Recent Labs Lab 01/13/14 1345 01/13/14 1648 01/14/14 0650  WBC 9.1 8.3 11.2*  HGB 6.8* 6.0* 9.0*  HCT 25.6* 22.4* 31.3*  MCV 59.7* 60.1* 65.9*  PLT 341 304 PENDING   Cardiac Enzymes: No results found for this basename: CKTOTAL, CKMB, CKMBINDEX, TROPONINI,  in the last 168 hours BNP (last 3 results) No results found for this basename: PROBNP,  in the last 8760 hours CBG:  Recent Labs Lab 01/14/14 0309 01/14/14 0420 01/14/14 0524 01/14/14 0611 01/14/14 0659  GLUCAP 138* 107* 120* 148* 207*    Recent Results (from the past 240 hour(s))  CULTURE, BLOOD (ROUTINE X 2)     Status: None   Collection Time    01/13/14  4:48 PM      Result Value Ref Range Status   Specimen Description BLOOD RIGHT WRIST   Final   Special Requests BOTTLES DRAWN AEROBIC AND ANAEROBIC 5 CC   Final   Culture  Setup Time     Final   Value: 01/13/2014 22:24     Performed at [facility]   Culture     Final   Value:        BLOOD CULTURE RECEIVED NO GROWTH TO DATE CULTURE WILL BE HELD FOR 5 DAYS BEFORE ISSUING A FINAL NEGATIVE REPORT     Performed at [facility]   Report Status PENDING   Incomplete  CULTURE, BLOOD (ROUTINE X 2)     Status: None   Collection Time    01/13/14  4:52 PM      Result Value Ref Range Status   Specimen Description BLOOD LEFT ARM   Final   Special Requests BOTTLES DRAWN AEROBIC AND ANAEROBIC 5 CC   Final   Culture  Setup Time     Final   Value: 01/13/2014 22:25     Performed at [facility]   Culture     Final   Value:        BLOOD CULTURE RECEIVED NO GROWTH TO DATE CULTURE WILL BE HELD FOR 5 DAYS BEFORE ISSUING A FINAL NEGATIVE REPORT     Performed at [facility]   Report Status PENDING   Incomplete  MRSA PCR SCREENING     Status: None   Collection Time    01/13/14 10:14 PM      Result Value Ref Range Status   MRSA by PCR NEGATIVE  NEGATIVE Final   Comment:            The GeneXpert MRSA Assay (FDA     approved for NASAL specimens     only), is one component of a     comprehensive MRSA colonization     surveillance program. It is not     intended to diagnose MRSA     infection nor to guide or     monitor treatment for     MRSA infections.     Studies: Dg Chest 2 View  01/13/2014   CLINICAL DATA:  Fever weakness and dry cough for 3 months  EXAM: CHEST  2 VIEW  COMPARISON:  None.  FINDINGS: The heart size and mediastinal contours are within normal limits. Both lungs are clear. The visualized skeletal structures are unremarkable.  IMPRESSION: No active cardiopulmonary disease.   Electronically Signed   By: Esperanza Heiraymond  Rubner M.D.   On: 01/13/2014 15:10   [place] Transvaginal Non-ob  01/13/2014   CLINICAL DATA:  Anemia.  History of diabetes.  EXAM: TRANSABDOMINAL AND TRANSVAGINAL ULTRASOUND OF PELVIS  TECHNIQUE: Both transabdominal and transvaginal ultrasound examinations of the pelvis were  performed.  Transabdominal technique was performed for global imaging of the pelvis including uterus, ovaries, adnexal regions, and pelvic cul-de-sac. It was necessary to proceed with endovaginal exam following the transabdominal exam to visualize the endometrium and ovaries to better advantage.  COMPARISON:  None  FINDINGS: Uterus  Measurements: 10.5 x 3.4 x 5.0 cm. No fibroids are identified. There are multiple cervical nabothian cysts.  Endometrium  Thickness: 4.0 mm.  No focal abnormality visualized.  Right ovary  Measurements: 2.9 x 2.0 x 1.9 cm. Normal appearance/no adnexal mass.  Left ovary  Measurements: 2.7 x 2.7 x 2.7 cm. Small collapsing follicle is noted. There is no suspicious adnexal finding.  Other findings  No free fluid.  IMPRESSION: No acute or significant findings identified. Cervical nabothian cysts and collapsing left ovarian follicle are noted.   Electronically Signed   By: Roxy Horseman M.D.   On: 01/13/2014 17:08   US Pelvis Complete  01/13/2014   CLINICAL DATA:  Anemia.  History of diabetes.  EXAM: TRANSABDOMINAL AND TRANSVAGINAL ULTRASOUND OF PELVIS  TECHNIQUE: Both transabdominal and transvaginal ultrasound examinations of the pelvis were performed. Transabdominal technique was performed for global imaging of the pelvis including uterus, ovaries, adnexal regions, and pelvic cul-de-sac. It was necessary to proceed with endovaginal exam following the transabdominal exam to visualize the endometrium and ovaries to better advantage.  COMPARISON:  None  FINDINGS: Uterus  Measurements: 10.5 x 3.4 x 5.0 cm. No fibroids are identified. There are multiple cervical nabothian cysts.  Endometrium  Thickness: 4.0 mm.  No focal abnormality visualized.  Right ovary  Measurements: 2.9 x 2.0 x 1.9 cm. Normal appearance/no adnexal mass.  Left ovary  Measurements: 2.7 x 2.7 x 2.7 cm. Small collapsing follicle is noted. There is no suspicious adnexal finding.  Other findings  No free fluid.  IMPRESSION: No acute or  significant findings identified. Cervical nabothian cysts and collapsing left ovarian follicle are noted.   Electronically Signed   By: Roxy Horseman M.D.   On: 01/13/2014 17:08    Scheduled Meds: . ceFEPime (MAXIPIME) IV  1 g Intravenous Q12H  . [START ON 01/15/2014] pneumococcal 23 valent vaccine  0.5 mL Intramuscular Tomorrow-1000  . potassium chloride  10 mEq Intravenous Q1 Hr x 4  . vancomycin  750 mg Intravenous Q12H   Continuous Infusions: . sodium chloride 150 mL/hr at 01/13/14 1849  . dextrose 5 % and 0.45% NaCl 125 mL/hr at 01/13/14 1845  . insulin (NOVOLIN-R) infusion 0.9 Units/hr (01/14/14 0422)    Active Problems:   DKA (diabetic ketoacidoses)   Anemia    Time spent: 30 min    Birdena Kingma  [facility] Pager [phone]. If 7PM-7AM, please contact night-coverage at [email or web address], password [facility] 01/14/2014, 7:55 AM  LOS: 1 day

## 2014-01-14 NOTE — Progress Notes (Addendum)
CRITICAL VALUE ALERT  Critical value received:  + blood cultures gram - rod   Date of notification:  01/14/2014  Time of notification:  1000  Critical value read back:Yes.    Nurse who received alert:  Dorthula RueBrittany Musial  MD notified (1st page):  MD M. Short  Time of first page:  1025  MD notified (2nd page):  Time of second page:  Responding MD:  MD M. Short  Time MD responded:  1028

## 2014-01-14 NOTE — Progress Notes (Signed)
Spoke with patient with the help of interpreter.  Was diagnosed with diabetes about 9 years ago. Had gestational diabetes when pregnant with child and was on insulin at that time.   Has been on oral medications until about 2 years ago when she could not afford them anymore.  Had been seen at the [facility] in [place], but has not been there for about 2 years.  HgbA1C is 9.2%.   Case management is seeing patient to try to get her scheduled for [place].  She states that she has an old blood glucose meter, but will need a new one along with strips, etc.  A prescription for meter can be taken to the [facility].   Spoke with RN about patient education.  Will order Living Well with Diabetes booklet in Spanish and an insulin starter kit in Spanish.  Staff will begin instruction on insulin administration and checking own CBGs in case patient is discharged on insulin. Dietician has spoken with patient. Will continue to follow while in hospital.  Harvel Ricks RN BSN CDE

## 2014-01-14 NOTE — Plan of Care (Signed)
Problem: Food- and Nutrition-Related Knowledge Deficit (NB-1.1) Goal: Nutrition education Formal process to instruct or train a patient/client in a skill or to impart knowledge to help patients/clients voluntarily manage or modify food choices and eating behavior to maintain or improve health. Outcome: Completed/Met Date Met:  01/14/14  RD consulted for nutrition education regarding diabetes. Patient declined an Veterinary surgeon for diet education. She states that she understands [place]. She was able to communicate effectively, asking and answering questions appropriately. Her husband was also present for diet education.   Lab Results  Component Value Date    HGBA1C 9.2* 01/13/2014    RD provided "Carbohydrate Counting for People with Diabetes" Spanish handout from the Academy of Nutrition and Dietetics. Discussed different food groups and their effects on blood sugar, emphasizing carbohydrate-containing foods. Reviewed portion size recommendations for foods that she usually consumes. Provided list of carbohydrates and recommended serving sizes of common foods.  Discussed importance of controlled and consistent carbohydrate intake throughout the day. Provided examples of ways to balance meals/snacks and encouraged intake of high-fiber, whole grain complex carbohydrates. Teach back method used.  Expect fair to good compliance.  Body mass index is 27.3 kg/(m^2). Pt meets criteria for overweight based on current BMI.  Current diet order is CHO-modified, patient is consuming approximately 50% of meals at this time. Labs and medications reviewed. No further nutrition interventions warranted at this time. RD contact information provided. If additional nutrition issues arise, please re-consult RD.   Molli Barrows, RD, LDN, [place] Pager [phone] After Hours Pager [phone]

## 2014-01-14 NOTE — Care Management Note (Unsigned)
    Page 1 of 2   01/15/2014     2:14:45 PM CARE MANAGEMENT NOTE 01/15/2014  Patient:  Nancy Guerra,Nancy Guerra   Account Number:  [id number]  Date Initiated:  01/14/2014  Documentation initiated by:  GRAVES-BIGELOW,Gaylon Bentz  Subjective/Objective Assessment:   Pt admitted for Generalized weakness, likely multifactorial due to infection, anemia, DKA. Pt initiated on insulin gtt.     Action/Plan:   Pt without PCP- CM did call the [facility] for f/u appointment for pt. Clinic to call back for appointment. CM did call Interpreter to speak to pt in regards to medications and hospital f/u- vm placed.  CM did call [facility].   Anticipated DC Date:  01/16/2014   Anticipated DC Plan:  HOME/SELF CARE  In-house referral  Financial Counselor      DC Planning Services  CM consult      Choice offered to / List presented to:             Status of service:  In process, will continue to follow Medicare Important Message given?  NO (If response is "NO", the following Medicare IM given date fields will be blank) Date Medicare IM given:   Medicare IM given by:   Date Additional Medicare IM given:   Additional Medicare IM given by:    Discharge Disposition:  HOME/SELF CARE  Per UR Regulation:  Reviewed for med. necessity/level of care/duration of stay  If discussed at Long Length of Stay Meetings, dates discussed:    Comments:  01-15-14 [address] Tomi BambergerBrenda Graves-Bigelow, RN,BSN [phone] CM [facility] Follow up placed in Epic. Pt is aware. CM did speak to pharmacy at clinic and they will be closed on Friday. If pt was to d/c today she could have received [facility]istar lantus pens for free and novolog vial for free. Since clinic will be closed July 3rd CM [facility] once pt is medically stale for d/c. CM will continue to monitor.    01-14-14 [address] Tomi BambergerBrenda Graves-Bigelow, RN,BSN [phone] [facility] with emergency  medicaid to pay this hospital stay. CM spoke to pt with Interpreter and she did go to [facility] in [place] and the cost for visits and meds are expensive. CM did provide pt with [facility] documentation. CM did call and waiting call back from Clinic. Pt is aware that if not able to get appointment she can walk over to clinic and possibly be seen. Pt is aware that a Pharmacy is onsite. Pt was on po meds for diabetes before admission. Diabetes Educator was called to visit pt while interpreter in room. CM did mention that [employer] with [employer] card diabetes meds some are free. If pt to go home on insulin 70/30 is cheap at [facility]. CM will continue to f[facility]u [facility]

## 2014-01-15 DIAGNOSIS — D649 Anemia, unspecified: Secondary | ICD-10-CM

## 2014-01-15 DIAGNOSIS — B9689 Other specified bacterial agents as the cause of diseases classified elsewhere: Secondary | ICD-10-CM

## 2014-01-15 DIAGNOSIS — E1310 Other specified diabetes mellitus with ketoacidosis without coma: Secondary | ICD-10-CM

## 2014-01-15 DIAGNOSIS — N10 Acute tubulo-interstitial nephritis: Secondary | ICD-10-CM

## 2014-01-15 DIAGNOSIS — R7881 Bacteremia: Secondary | ICD-10-CM

## 2014-01-15 LAB — TYPE AND SCREEN
ABO/RH(D): O POS
ANTIBODY SCREEN: NEGATIVE
UNIT DIVISION: 0
Unit division: 0

## 2014-01-15 LAB — CBC
HCT: 29.5 % — ABNORMAL LOW (ref 36.0–46.0)
Hemoglobin: 8.4 g/dL — ABNORMAL LOW (ref 12.0–15.0)
MCH: 18.7 pg — AB (ref 26.0–34.0)
MCHC: 28.5 g/dL — AB (ref 30.0–36.0)
MCV: 65.6 fL — AB (ref 78.0–100.0)
PLATELETS: 320 10*3/uL (ref 150–400)
RBC: 4.5 MIL/uL (ref 3.87–5.11)
RDW: 24 % — ABNORMAL HIGH (ref 11.5–15.5)
WBC: 8.8 10*3/uL (ref 4.0–10.5)

## 2014-01-15 LAB — BASIC METABOLIC PANEL
Anion gap: 13 (ref 5–15)
BUN: 4 mg/dL — AB (ref 6–23)
CO2: 23 mEq/L (ref 19–32)
CREATININE: 0.42 mg/dL — AB (ref 0.50–1.10)
Calcium: 8.5 mg/dL (ref 8.4–10.5)
Chloride: 100 mEq/L (ref 96–112)
GLUCOSE: 192 mg/dL — AB (ref 70–99)
Potassium: 3.3 mEq/L — ABNORMAL LOW (ref 3.7–5.3)
Sodium: 136 mEq/L — ABNORMAL LOW (ref 137–147)

## 2014-01-15 LAB — GLUCOSE, CAPILLARY
GLUCOSE-CAPILLARY: 228 mg/dL — AB (ref 70–99)
GLUCOSE-CAPILLARY: 178 mg/dL — AB (ref 70–99)
GLUCOSE-CAPILLARY: 185 mg/dL — AB (ref 70–99)
Glucose-Capillary: 222 mg/dL — ABNORMAL HIGH (ref 70–99)
Glucose-Capillary: 170 mg/dL — ABNORMAL HIGH (ref 70–99)

## 2014-01-15 MED ORDER — INSULIN GLARGINE 100 UNIT/ML ~~LOC~~ SOLN
20.0000 [IU] | Freq: Every day | SUBCUTANEOUS | Status: DC
  Administered 2014-01-15 – 2014-01-16 (×2): 20 [IU] via SUBCUTANEOUS
  Filled 2014-01-15 (×2): qty 0.2

## 2014-01-15 MED ORDER — ACETAMINOPHEN 500 MG PO TABS: 1000.0000 mg | ORAL_TABLET | Freq: Four times a day (QID) | ORAL | Status: DC | PRN

## 2014-01-15 MED ORDER — POTASSIUM CHLORIDE CRYS ER 20 MEQ PO TBCR
40.0000 meq | EXTENDED_RELEASE_TABLET | Freq: Once | ORAL | Status: AC
  Administered 2014-01-15: 40 meq via ORAL
  Filled 2014-01-15: qty 2

## 2014-01-15 NOTE — Progress Notes (Addendum)
[facility] PROGRESS NOTE  Nancy Guerra WUX:[id number] DOB: 01/25/1971 DOA: 01/13/2014 PCP: Pcp Not In System  Assessment/Plan  GNR septicemia, source is likely pyelonephritis -  F/u urine culture -  1/2 blood cultures from 6/30 growing GNR -  F/u BCx 7/1 -  D/c vancomycin -  Continue cefepime day 2  Generalized weakness, likely multifactorial due to infection, anemia, DKA  -  PT eval:  No follow up  Diabetic ketoacidosis, likely precipitated by underlying infection, improved but CBG still elevated -  A1c 9.2 -  Increase lantus to 20 units -  Continue mod dose SSI for now and adjust prn -  Patient received diabetic education yesterday -  Daily BMP  Iron deficiency anemia, denies heavy vaginal bleeding.  Vag US unremarkable.  Occult stool neg, but states her stools have been darker than usual  -  S/p 2 units PRBC on 6/30 -  Continue iron supplementation TID -  Needs GYN and GI follow up to determine source of blood loss and correct   Leukocytosis, likely reactive from DKA and underlying infection.  Improved.  Pseudohyponatremia due to hyperglycemia.  Continue to correct blood sugars.  Hypokalemia, likely due to poor oral intake, recent DKA -  Oral repletion  Diet:  diabetic Access:  PIV IVF:  off Proph:  SCDs (due to possible GIB)  Code Status: full Family Communication: patient alone with interpreter Disposition Plan: transfer to med-surg    Consultants:  None  Procedures:  CXR  Pelvic/transvaginal US  Antibiotics:  Vanc from 6/30 > 7/2  Cefepime from 7/1   HPI/Subjective:  Poor appetite.  Has some back pain for last few hours, but otherwise feeling okay.  Denies nausea.  Objective: Filed Vitals:   01/14/14 1937 01/14/14 2321 01/15/14 0443 01/15/14 0700  BP: 108/74 125/73 115/69   Pulse: 99 81 83   Temp: 98.7 F (37.1 C) 98.3 F (36.8 C) 98.5 F (36.9 C) 98.7 F (37.1 C)  TempSrc: Oral Oral Oral Oral  Resp: 27 20 24    Height:       Weight:      SpO2: 97% 99% 100%     Intake/Output Summary (Last 24 hours) at 01/15/14 0757 Last data filed at 01/15/14 0555  Gross per 24 hour  Intake 1542.97 ml  Output      0 ml  Net 1542.97 ml   Filed Weights   01/13/14 2108  Weight: 65.5 kg (144 lb 6.4 oz)    Exam:   General:  Hispanic F, No acute distress  HEENT:  NCAT, MMM  Cardiovascular:  RRR, nl S1, S2 no mrg, 2+ pulses, warm extremities  Respiratory:  CTAB, no increased WOB  Abdomen:   NABS, soft, NT/ND, + flank pain  MSK:   Normal tone and bulk, no LEE  Neuro:  Grossly intact  Data Reviewed: Basic Metabolic Panel:  Recent Labs Lab 01/13/14 2359 01/14/14 0650 01/14/14 0845 01/14/14 1857 01/15/14 0243  NA 136* 137 135* 135* 136*  K 3.0* 3.7 3.6* 3.6* 3.3*  CL 101 102 102 99 100  CO2 21 20 21 22 23   GLUCOSE 206* 201* 185* 183* 192*  BUN 4* 3* <3* 3* 4*  CREATININE 0.41* 0.33* 0.36* 0.40* 0.42*  CALCIUM 7.9* 7.8* 7.8* 8.4 8.5   Liver Function Tests:  Recent Labs Lab 01/13/14 1648  AST 18  ALT 15  ALKPHOS 99  BILITOT 0.3  PROT 6.7  ALBUMIN 2.7*   No results found for this basename: LIPASE,  AMYLASE,  in the last 168 hours No results found for this basename: AMMONIA,  in the last 168 hours CBC:  Recent Labs Lab 01/13/14 1345 01/13/14 1648 01/14/14 0650 01/15/14 0243  WBC 9.1 8.3 11.2* 8.8  HGB 6.8* 6.0* 9.0* 8.4*  HCT 25.6* 22.4* 31.3* 29.5*  MCV 59.7* 60.1* 65.9* 65.6*  PLT 341 304 298 320   Cardiac Enzymes: No results found for this basename: CKTOTAL, CKMB, CKMBINDEX, TROPONINI,  in the last 168 hours BNP (last 3 results) No results found for this basename: PROBNP,  in the last 8760 hours CBG:  Recent Labs Lab 01/14/14 1102 01/14/14 1141 01/14/14 1628 01/14/14 2127 01/15/14 0737  GLUCAP 201* 168* 212* 177* 222*    Recent Results (from the past 240 hour(s))  CULTURE, BLOOD (ROUTINE X 2)     Status: None   Collection Time    01/13/14  4:48 PM      Result  Value Ref Range Status   Specimen Description BLOOD RIGHT WRIST   Final   Special Requests BOTTLES DRAWN AEROBIC AND ANAEROBIC 5 CC   Final   Culture  Setup Time     Final   Value: 01/13/2014 22:24     Performed at [facility]   Culture     Final   Value:        BLOOD CULTURE RECEIVED NO GROWTH TO DATE CULTURE WILL BE HELD FOR 5 DAYS BEFORE ISSUING A FINAL NEGATIVE REPORT     Performed at [facility]   Report Status PENDING   Incomplete  CULTURE, BLOOD (ROUTINE X 2)     Status: None   Collection Time    01/13/14  4:52 PM      Result Value Ref Range Status   Specimen Description BLOOD LEFT ARM   Final   Special Requests BOTTLES DRAWN AEROBIC AND ANAEROBIC 5 CC   Final   Culture  Setup Time     Final   Value: 01/13/2014 22:25     Performed at [facility]   Culture     Final   Value: GRAM NEGATIVE RODS     Note: Gram Stain Report Called to,Read Back By and Verified With: BRITTANY NUSIAL@1003  ON [id number] BY [facility]     Performed at [facility]   Report Status PENDING   Incomplete  MRSA PCR SCREENING     Status: None   Collection Time    01/13/14 10:14 PM      Result Value Ref Range Status   MRSA by PCR NEGATIVE  NEGATIVE Final   Comment:            The GeneXpert MRSA Assay (FDA     approved for NASAL specimens     only), is one component of a     comprehensive MRSA colonization     surveillance program. It is not     intended to diagnose MRSA     infection nor to guide or     monitor treatment for     MRSA infections.     Studies: Dg Chest 2 View  01/13/2014   CLINICAL DATA:  Fever weakness and dry cough for 3 months  EXAM: CHEST  2 VIEW  COMPARISON:  None.  FINDINGS: The heart size and mediastinal contours are within normal limits. Both lungs are clear. The visualized skeletal structures are unremarkable.  IMPRESSION: No active cardiopulmonary disease.   Electronically Signed   By: Edgar Friskaymond  Rubner M.D.  On: 01/13/2014 15:10   [place] Transvaginal  Non-ob  01/13/2014   CLINICAL DATA:  Anemia.  History of diabetes.  EXAM: TRANSABDOMINAL AND TRANSVAGINAL ULTRASOUND OF PELVIS  TECHNIQUE: Both transabdominal and transvaginal ultrasound examinations of the pelvis were performed. Transabdominal technique was performed for global imaging of the pelvis including uterus, ovaries, adnexal regions, and pelvic cul-de-sac. It was necessary to proceed with endovaginal exam following the transabdominal exam to visualize the endometrium and ovaries to better advantage.  COMPARISON:  None  FINDINGS: Uterus  Measurements: 10.5 x 3.4 x 5.0 cm. No fibroids are identified. There are multiple cervical nabothian cysts.  Endometrium  Thickness: 4.0 mm.  No focal abnormality visualized.  Right ovary  Measurements: 2.9 x 2.0 x 1.9 cm. Normal appearance/no adnexal mass.  Left ovary  Measurements: 2.7 x 2.7 x 2.7 cm. Small collapsing follicle is noted. There is no suspicious adnexal finding.  Other findings  No free fluid.  IMPRESSION: No acute or significant findings identified. Cervical nabothian cysts and collapsing left ovarian follicle are noted.   Electronically Signed   By: Roxy HorsemanBill  Veazey M.D.   On: 01/13/2014 17:08   [place] Pelvis Complete  01/13/2014   CLINICAL DATA:  Anemia.  History of diabetes.  EXAM: TRANSABDOMINAL AND TRANSVAGINAL ULTRASOUND OF PELVIS  TECHNIQUE: Both transabdominal and transvaginal ultrasound examinations of the pelvis were performed. Transabdominal technique was performed for global imaging of the pelvis including uterus, ovaries, adnexal regions, and pelvic cul-de-sac. It was necessary to proceed with endovaginal exam following the transabdominal exam to visualize the endometrium and ovaries to better advantage.  COMPARISON:  None  FINDINGS: Uterus  Measurements: 10.5 x 3.4 x 5.0 cm. No fibroids are identified. There are multiple cervical nabothian cysts.  Endometrium  Thickness: 4.0 mm.  No focal abnormality visualized.  Right ovary  Measurements: 2.9 x  2.0 x 1.9 cm. Normal appearance/no adnexal mass.  Left ovary  Measurements: 2.7 x 2.7 x 2.7 cm. Small collapsing follicle is noted. There is no suspicious adnexal finding.  Other findings  No free fluid.  IMPRESSION: No acute or significant findings identified. Cervical nabothian cysts and collapsing left ovarian follicle are noted.   Electronically Signed   By: Roxy HorsemanBill  Veazey M.D.   On: 01/13/2014 17:08    Scheduled Meds: . ceFEPime (MAXIPIME) IV  1 g Intravenous Q12H  . ferrous sulfate  325 mg Oral TID WC  . insulin aspart  0-15 Units Subcutaneous TID WC  . insulin aspart  0-5 Units Subcutaneous QHS  . insulin glargine  20 Units Subcutaneous Daily  . pneumococcal 23 valent vaccine  0.5 mL Intramuscular Tomorrow-1000  . vancomycin  750 mg Intravenous Q12H   Continuous Infusions:    Active Problems:   DKA (diabetic ketoacidoses)   Anemia, iron deficiency   Leukocytosis, unspecified   Severe sepsis(995.92)    Time spent: 30 min    Daziyah Cogan, [facility]  [facility] Pager [phone]. If 7PM-7AM, please contact night-coverage at [email or web address], password [facility] 01/15/2014, 7:57 AM  LOS: 2 days

## 2014-01-15 NOTE — Progress Notes (Signed)
Physical Therapy Treatment and D/C Patient Details Name: Nancy Guerra MRN: [id number] DOB: [birth year] Today's Date: 02-05-14    History of Present Illness Pt admit with DKA.      PT Comments    Pt admitted with above.   Pt currently without  functional limitations and is ambulating independently.   Pt will no longer need skilled PT.  D/C PT.  Goals met.  No further needs.    Follow Up Recommendations  No PT follow up     Equipment Recommendations  None recommended by PT    Recommendations for Other Services       Precautions / Restrictions Precautions Precautions: None Restrictions Weight Bearing Restrictions: No    Mobility  Bed Mobility Overal bed mobility: Independent                Transfers Overall transfer level: Independent                  Ambulation/Gait Ambulation/Gait assistance: Independent Ambulation Distance (Feet): 400 Feet Assistive device: None Gait Pattern/deviations: Step-through pattern   Gait velocity interpretation: at or above normal speed for age/gender General Gait Details: No problems with balance.     Stairs Stairs: Yes Stairs assistance: Independent Stair Management: No rails;Forwards;Alternating pattern Number of Stairs: 8 General stair comments: No difficulties on steps.  Wheelchair Mobility    Modified Rankin (Stroke Patients Only)       Balance Overall balance assessment: Independent         Standing balance support: No upper extremity supported;During functional activity Standing balance-Leahy Scale: Good               High level balance activites: Direction changes;Turns;Sudden stops;Head turns High Level Balance Comments: all of above with independence    Cognition Arousal/Alertness: Awake/alert Behavior During Therapy: WFL for tasks assessed/performed Overall Cognitive Status: Within Functional Limits for tasks assessed                      Exercises      General  Comments        Pertinent Vitals/Pain VSS, No pain    Home Living                      Prior Function            PT Goals (current goals can now be found in the care plan section) Progress towards PT goals: Goals met/education completed, patient discharged from PT    Frequency       PT Plan Current plan remains appropriate    Co-evaluation             End of Session Equipment Utilized During Treatment: Gait belt Activity Tolerance: Patient tolerated treatment well Patient left: in chair;with call bell/phone within reach     Time: 1100-1118 PT Time Calculation (min): 18 min  Charges:  $Gait Training: 8-22 mins                    G Codes:      INGOLD,Sharief Wainwright 05-Feb-2014, 12:23 PM [facility] [place] [phone] (pager)

## 2014-01-15 NOTE — Progress Notes (Signed)
Pt transferred to the unit at 1415. Pt mental status is A&Ox4. Pt speaks very little [place]. Primary language is Spanish. Pt oriented to room, staff, and call bell. Skin is intact. Full assessment charted in [facility]. Call bell within reach.

## 2014-01-15 NOTE — H&P (Signed)
Report called to Verdon CumminsJesse, Charity fundraiserN.  All questions answered.  Interpreter informed patient to notify family of transfer to 5W-10.  Patient without complaints.

## 2014-01-15 NOTE — Clinical Documentation Improvement (Signed)
   Possible Clinical Conditions?    Hyponatremia                                Other Condition___________________                 Cannot Clinically Determine_________   Supporting Information: Risk Factors:  DKA  Normal Sodium level 137-147 Sodium levels 6/30: @ 1330= 128; @ 1648 = 134; @2359  = 136 7/1: 137   Treatment:  6/30:  IV NS 1L bolus 6/30: IV NS 500ml bolus  Thank You, Harless Littenebora T Peachie Barkalow ,RN Clinical Documentation Specialist:  [phone]  [facility]- Health Information Management

## 2014-01-16 DIAGNOSIS — A419 Sepsis, unspecified organism: Secondary | ICD-10-CM

## 2014-01-16 DIAGNOSIS — R6520 Severe sepsis without septic shock: Secondary | ICD-10-CM

## 2014-01-16 DIAGNOSIS — A4151 Sepsis due to Escherichia coli [E. coli]: Secondary | ICD-10-CM

## 2014-01-16 LAB — BASIC METABOLIC PANEL
Anion gap: 16 — ABNORMAL HIGH (ref 5–15)
BUN: 7 mg/dL (ref 6–23)
CHLORIDE: 98 meq/L (ref 96–112)
CO2: 22 mEq/L (ref 19–32)
Calcium: 8.7 mg/dL (ref 8.4–10.5)
Creatinine, Ser: 0.29 mg/dL — ABNORMAL LOW (ref 0.50–1.10)
GFR calc Af Amer: >90 mL/min (ref >90)
GFR calc non Af Amer: >90 mL/min (ref >90)
Glucose, Bld: 181 mg/dL — ABNORMAL HIGH (ref 70–99)
Potassium: 4.4 mEq/L (ref 3.7–5.3)
SODIUM: 136 meq/L — AB (ref 137–147)

## 2014-01-16 LAB — CBC
HEMATOCRIT: 30 % — AB (ref 36.0–46.0)
Hemoglobin: 8.7 g/dL — ABNORMAL LOW (ref 12.0–15.0)
MCH: 19.1 pg — AB (ref 26.0–34.0)
MCHC: 29 g/dL — ABNORMAL LOW (ref 30.0–36.0)
MCV: 65.8 fL — ABNORMAL LOW (ref 78.0–100.0)
PLATELETS: 355 10*3/uL (ref 150–400)
RBC: 4.56 MIL/uL (ref 3.87–5.11)
RDW: 25.2 % — AB (ref 11.5–15.5)
WBC: 6.4 10*3/uL (ref 4.0–10.5)

## 2014-01-16 LAB — CULTURE, BLOOD (ROUTINE X 2)

## 2014-01-16 LAB — GLUCOSE, CAPILLARY: Glucose-Capillary: 185 mg/dL — ABNORMAL HIGH (ref 70–99)

## 2014-01-16 LAB — URINE CULTURE: Colony Count: >=100000

## 2014-01-16 MED ORDER — BLOOD GLUCOSE METER KIT: PACK | Status: AC

## 2014-01-16 MED ORDER — INSULIN ASPART 100 UNIT/ML ~~LOC~~ SOLN: 5.0000 [IU] | Freq: Three times a day (TID) | SUBCUTANEOUS | Status: DC

## 2014-01-16 MED ORDER — INSULIN GLARGINE 100 UNIT/ML ~~LOC~~ SOLN: 20.0000 [IU] | Freq: Every day | SUBCUTANEOUS | Status: DC

## 2014-01-16 MED ORDER — FERROUS SULFATE 325 (65 FE) MG PO TABS: 325.0000 mg | ORAL_TABLET | Freq: Three times a day (TID) | ORAL | Status: DC

## 2014-01-16 MED ORDER — "INSULIN SYRINGE 31G X 5/16"" 0.3 ML MISC": Status: DC

## 2014-01-16 MED ORDER — CIPROFLOXACIN HCL 500 MG PO TABS: 500.0000 mg | ORAL_TABLET | Freq: Two times a day (BID) | ORAL | Status: DC

## 2014-01-16 NOTE — Progress Notes (Signed)
Patient discharge teaching given through interpreter, including activity, diet, follow-up appoints, and medications. Care manager provided information about the medication assistance program and supplied them with a card and paperwork. Patient verbalized understanding of all discharge instructions. IV access was d/c'd. Vitals are stable. Skin is intact except as charted in most recent assessments. Pt to be escorted out by NT, to be driven home by family.

## 2014-01-16 NOTE — Progress Notes (Signed)
01/16/14 MATCH program with copay override for cipro, lantus, and novolog provided for patient. MATCH letter given to patient through Spanish interpreter.Spanish pharmacy discount card given. [facility] Jacquelynn CreeMary Wilho Sharpley RN, BSn, CCM

## 2014-01-16 NOTE — Progress Notes (Signed)
Interpreter Wyvonnia DuskyGraciela Namihira for [facility] RN discharge instructions

## 2014-01-16 NOTE — Discharge Summary (Signed)
Physician Discharge Summary  Adysen Raphael NLZ:[id number] DOB: [birth year] DOA: 01/13/2014  PCP: Pcp Not In System  Admit date: 01/13/2014 Discharge date: 01/16/2014  Recommendations for Outpatient Follow-up:  1. F/u with primary care doctor within 2 weeks of discharge.   2. Advised to check blood sugars 4 times daily, record the numbers, and bring them with her to her followup appointment 3. Given vial insulin so that she could use match program for insulin prescriptions until she can follow up with community [facility] and wellness 4. Recommend referral to GYN and gastroenterology 5. F/u final blood culture report from 7/1.  Discharge Diagnoses:  Principal Problem:   DKA (diabetic ketoacidoses)   E. coli septicemia due to acute pyelonephritis Active Problems:   Anemia, iron deficiency   Leukocytosis, unspecified   Severe sepsis(995.92)     Discharge Condition: Stable, improved  Diet recommendation: Diabetic  Wt Readings from Last 3 Encounters:  01/15/14 63.504 kg (140 lb)    History of present illness:  43 year old female, diabetic presents to the ER because of generalized weakness and fatigue progressively worsening over the past 2 weeks.,and cough for the last couple of days. The patient is a diabetic but has been noncompliant for the last 3-4 years with her oral hypoglycemics. She does not recall any fever at home but was found to have a fever of 100.7 in the ED. Initially tachycardic with a heart rate of 902, systolic blood pressure of 107. Yesterday she took Advil and started sweating after that.  The patient was found to be fairly anemic with a hemoglobin of 6.8. CBg was greater than 400. She also had one episode of chest pressure this morning, without any associated shortness of breath. She did complain of some lightheadedness and fatigue which has been progressively getting worse over the last 2 weeks. Denies any loss of consciousness or syncope. She also has a mild headache  for the last couple of days. She was guaiac negative in the ER. She denies any heavy menstrual periods, denies any history of fibroids. She was told several years ago by her primary care provider that she was anemic. She denies any polyuria or polydipsia. She has had loss of appetite for the last couple of days. She has an upcoming appointment at the [facility] in [place].  When questioned, the patient states that 3 weeks ago the patient had made a trip to the [place], and the beach. She did go hiking in the mountains with her family. She does not remember any tick bites or skin rashes.    Hospital Course:   Escherichia coli septicemia secondary to pyelonephritis. Her urine culture grew pansensitive Escherichia coli in one of 2 blood cultures from 6/30 also grew Escherichia coli that was pansensitive. Followup blood culture from 7/1 was negative. Because of concern for her severe sepsis, she was initially started on vancomycin and cefepime. After cultures were negative for gram-positive bacteria, her vancomycin was discontinued and she continued monotherapy cefepime. After the results of her urine culture and blood culture came back on 7/3 she was transitioned to ciprofloxacin. She should continue a 14 day course. She was likely predispose to severe urinary tract infection secondary to her uncontrolled diabetes.  Generalized weakness, with likely multifactorial due to infection, anemia, and DKA. She was evaluated by physical therapy who recommended no further followup.  Diabetic ketoacidosis, likely precipitated by underlying infection.  Her initial bicarbonate was 18 with an anion gap of 21. Her CBG was 389. She  was started on an insulin infusion and after her anion gap trended down and her bicarbonate normalized, she was transitioned to subcutaneous insulin. Her blood sugars have been in the high 100s on her current insulin regimen.  Her hemoglobin A1c was 9.2. She notes a  diabetic educator and nutritionist. She should check her CBGs 4 times daily, record the numbers, bring them with her to her primary care doctor's appointment. She will likely need to have adjustments made to her insulin regimen.  Iron deficiency anemia. Initially she endorsed heavy vaginal bleeding, however when I would ask her again later she denied heavy vaginal bleeding.  She stated that her stools have been darker than usual recently. Her occult stool was negative. She underwent a vaginal ultrasound which was also unremarkable. She received 2 units of packed red blood cells on 6/30 DT hemoglobin of 6.0. Her hemoglobin subsequently increased to 9.0 and remained in the mid 8 mg/dL range until discharge. She was started on 3 times a day iron and to followup with gynecology and gastroenterology for further evaluation.  Leukocytosis was likely reactive from diabetic ketoacidosis an underlying infection and improved with antibiotics.  Pseudohyponatremia due to hyperglycemia and hyponatremia D2 dehydration improved with IV fluids and correction of blood sugars.  Hypokalemia was likely due to poor oral intake and diabetic ketoacidosis and resolved with oral supplementation.  Consultants:  None Procedures:  CXR  Pelvic/transvaginal US Antibiotics:  Vanc from 6/30 > 7/2  Cefepime from 7/1    Discharge Exam: Filed Vitals:   01/16/14 0426  BP: 127/81  Pulse: 74  Temp: 98.4 F (36.9 C)  Resp: 20   Filed Vitals:   01/15/14 1544 01/15/14 1749 01/15/14 2008 01/16/14 0426  BP: 111/76 129/83 144/84 127/81  Pulse: 83 82 81 74  Temp: 98.6 F (37 C) 98.5 F (36.9 C) 98.2 F (36.8 C) 98.4 F (36.9 C)  TempSrc: Oral Oral Oral Oral  Resp: _0 Height:  _1  (1.549 m)    Weight:  63.504 kg (140 lb)    SpO2: 98% 98% 97% 98%    General: Hispanic F, No acute distress  HEENT: NCAT, MMM  Cardiovascular: RRR, nl S1, S2 no mrg, 2+ pulses, warm extremities  Respiratory: CTAB, no increased  WOB  Abdomen: NABS, soft, NT/ND, + flank pain  MSK: Normal tone and bulk, no LEE  Neuro: Grossly intact   Discharge Instructions      Discharge Instructions   Call MD for:  difficulty breathing, headache or visual disturbances    Complete by:  As directed      Call MD for:  extreme fatigue    Complete by:  As directed      Call MD for:  hives    Complete by:  As directed      Call MD for:  persistant dizziness or light-headedness    Complete by:  As directed      Call MD for:  persistant nausea and vomiting    Complete by:  As directed      Call MD for:  severe uncontrolled pain    Complete by:  As directed      Call MD for:  temperature >100.4    Complete by:  As directed      Diet Carb Modified    Complete by:  As directed      Discharge instructions    Complete by:  As directed   You were hospitalized with diabetes, anemia (  low blood count), and urinary tract infection.  For your diabetes, take the long-acting insulin lantus 20 units once a day.  Take the Garnetta Fedrick-acting insulin aspart 5 units before breakfast, lunch, and dinner.  Please check your blood sugars 4 times a day, before breakfast, lunch, dinner, and bed, and write down the numbers so that you can take them with you to your primary care doctor appointment.  Your primary care doctor will use those numbers to adjust your insulin doses.  For your anemia, you were transfused 2 units of blood and started on iron supplements.  Your doctor may refer you for further testing by specialist to figure out why you are iron deficient.  Finally, please take your antibiotic ciprofloxacin starting tonight and continue taking it twice a day until all the pills are gone.     Increase activity slowly    Complete by:  As directed             Medication List         blood glucose meter kit and supplies  Dispense based on patient and insurance preference. Use up to four times daily as directed. (FOR ICD-9 250.00, 250.01).      ciprofloxacin 500 MG tablet  Commonly known as:  CIPRO  Take 1 tablet (500 mg total) by mouth 2 (two) times daily.     ferrous sulfate 325 (65 FE) MG tablet  Take 1 tablet (325 mg total) by mouth 3 (three) times daily with meals.     insulin aspart 100 UNIT/ML injection  Commonly known as:  NOVOLOG  Inject 5 Units into the skin 3 (three) times daily before meals.     insulin glargine 100 UNIT/ML injection  Commonly known as:  LANTUS  Inject 0.2 mLs (20 Units total) into the skin daily.     INSULIN SYRINGE .3CC/31GX5/16" 31G X 5/16" 0.3 ML Misc  Use four times daily for insulin injections.     nivea cream  Apply 1 application topically as needed for dry skin.     TYLENOL PO  Take 2 tablets by mouth daily as needed (for pain or headache).       Follow-up Information   Follow up with [facility]     On 01/28/2014. (@ 1030 for hospital follow up with MD Doyle Askew. )    Contact information:   [place]  [id number] [phone]       The results of significant diagnostics from this hospitalization (including imaging, microbiology, ancillary and laboratory) are listed below for reference.    Significant Diagnostic Studies: Dg Chest 2 View  01/13/2014   CLINICAL DATA:  Fever weakness and dry cough for 3 months  EXAM: CHEST  2 VIEW  COMPARISON:  None.  FINDINGS: The heart size and mediastinal contours are within normal limits. Both lungs are clear. The visualized skeletal structures are unremarkable.  IMPRESSION: No active cardiopulmonary disease.   Electronically Signed   By: Skipper Cliche M.D.   On: 01/13/2014 15:10   US Transvaginal Non-ob  01/13/2014   CLINICAL DATA:  Anemia.  History of diabetes.  EXAM: TRANSABDOMINAL AND TRANSVAGINAL ULTRASOUND OF PELVIS  TECHNIQUE: Both transabdominal and transvaginal ultrasound examinations of the pelvis were performed. Transabdominal technique was performed for global imaging of the pelvis  including uterus, ovaries, adnexal regions, and pelvic cul-de-sac. It was necessary to proceed with endovaginal exam following the transabdominal exam to visualize the endometrium and ovaries to better advantage.  COMPARISON:  None  FINDINGS: Uterus  Measurements: 10.5 x 3.4 x 5.0 cm. No fibroids are identified. There are multiple cervical nabothian cysts.  Endometrium  Thickness: 4.0 mm.  No focal abnormality visualized.  Right ovary  Measurements: 2.9 x 2.0 x 1.9 cm. Normal appearance/no adnexal mass.  Left ovary  Measurements: 2.7 x 2.7 x 2.7 cm. Small collapsing follicle is noted. There is no suspicious adnexal finding.  Other findings  No free fluid.  IMPRESSION: No acute or significant findings identified. Cervical nabothian cysts and collapsing left ovarian follicle are noted.   Electronically Signed   By: Camie Patience M.D.   On: 01/13/2014 17:08   US Pelvis Complete  01/13/2014   CLINICAL DATA:  Anemia.  History of diabetes.  EXAM: TRANSABDOMINAL AND TRANSVAGINAL ULTRASOUND OF PELVIS  TECHNIQUE: Both transabdominal and transvaginal ultrasound examinations of the pelvis were performed. Transabdominal technique was performed for global imaging of the pelvis including uterus, ovaries, adnexal regions, and pelvic cul-de-sac. It was necessary to proceed with endovaginal exam following the transabdominal exam to visualize the endometrium and ovaries to better advantage.  COMPARISON:  None  FINDINGS: Uterus  Measurements: 10.5 x 3.4 x 5.0 cm. No fibroids are identified. There are multiple cervical nabothian cysts.  Endometrium  Thickness: 4.0 mm.  No focal abnormality visualized.  Right ovary  Measurements: 2.9 x 2.0 x 1.9 cm. Normal appearance/no adnexal mass.  Left ovary  Measurements: 2.7 x 2.7 x 2.7 cm. Small collapsing follicle is noted. There is no suspicious adnexal finding.  Other findings  No free fluid.  IMPRESSION: No acute or significant findings identified. Cervical nabothian cysts and collapsing  left ovarian follicle are noted.   Electronically Signed   By: Camie Patience M.D.   On: 01/13/2014 17:08    Microbiology: Recent Results (from the past 240 hour(s))  URINE CULTURE     Status: None   Collection Time    01/13/14  2:26 PM      Result Value Ref Range Status   Specimen Description URINE, RANDOM   Final   Special Requests ADDED [id number]   Final   Culture  Setup Time     Final   Value: 01/14/2014 04:43     Performed at [facility]     Final   Value: >=100,000 COLONIES/ML     Performed at [facility]   Culture     Final   Value: ESCHERICHIA COLI     Performed at [facility]   Report Status 01/16/2014 FINAL   Final   Organism ID, Bacteria ESCHERICHIA COLI   Final  CULTURE, BLOOD (ROUTINE X 2)     Status: None   Collection Time    01/13/14  4:48 PM      Result Value Ref Range Status   Specimen Description BLOOD RIGHT WRIST   Final   Special Requests BOTTLES DRAWN AEROBIC AND ANAEROBIC 5 CC   Final   Culture  Setup Time     Final   Value: 01/13/2014 22:24     Performed at [facility]   Culture     Final   Value:        BLOOD CULTURE RECEIVED NO GROWTH TO DATE CULTURE WILL BE HELD FOR 5 DAYS BEFORE ISSUING A FINAL NEGATIVE REPORT     Performed at [facility]   Report Status PENDING   Incomplete  CULTURE, BLOOD (ROUTINE X 2)  Status: None   Collection Time    01/13/14  4:52 PM      Result Value Ref Range Status   Specimen Description BLOOD LEFT ARM   Final   Special Requests BOTTLES DRAWN AEROBIC AND ANAEROBIC 5 CC   Final   Culture  Setup Time     Final   Value: 01/13/2014 22:25     Performed at [facility]   Culture     Final   Value: ESCHERICHIA COLI     Note: Gram Stain Report Called to,Read Back By and Verified With: BRITTANY NUSIAL_[email or web address]  ON [id number] BY [facility]     Performed at [facility]   Report Status 01/16/2014 FINAL   Final   Organism ID, Bacteria ESCHERICHIA COLI   Final   MRSA PCR SCREENING     Status: None   Collection Time    01/13/14 10:14 PM      Result Value Ref Range Status   MRSA by PCR NEGATIVE  NEGATIVE Final   Comment:            The GeneXpert MRSA Assay (FDA     approved for NASAL specimens     only), is one component of a     comprehensive MRSA colonization     surveillance program. It is not     intended to diagnose MRSA     infection nor to guide or     monitor treatment for     MRSA infections.  CULTURE, BLOOD (SINGLE)     Status: None   Collection Time    01/14/14 12:40 PM      Result Value Ref Range Status   Specimen Description BLOOD RIGHT ANTECUBITAL   Final   Special Requests     Final   Value: BOTTLES DRAWN AEROBIC AND ANAEROBIC BLUE 10CC RED [place]   Culture  Setup Time     Final   Value: 01/14/2014 16:59     Performed at [facility]   Culture     Final   Value:        BLOOD CULTURE RECEIVED NO GROWTH TO DATE CULTURE WILL BE HELD FOR 5 DAYS BEFORE ISSUING A FINAL NEGATIVE REPORT     Performed at [facility]   Report Status PENDING   Incomplete     Labs: Basic Metabolic Panel:  Recent Labs Lab 01/14/14 0650 01/14/14 0845 01/14/14 1857 01/15/14 0243 01/16/14 0438  NA 137 135* 135* 136* 136*  K 3.7 3.6* 3.6* 3.3* 4.4  CL 102 102 99 100 98  CO2 _1 GLUCOSE 201* 185* 183* 192* 181*  BUN 3* <3* 3* 4* 7  CREATININE 0.33* 0.36* 0.40* 0.42* 0.29*  CALCIUM 7.8* 7.8* 8.4 8.5 8.7   Liver Function Tests:  Recent Labs Lab 01/13/14 1648  AST 18  ALT 15  ALKPHOS 99  BILITOT 0.3  PROT 6.7  ALBUMIN 2.7*   No results found for this basename: LIPASE, AMYLASE,  in the last 168 hours No results found for this basename: AMMONIA,  in the last 168 hours CBC:  Recent Labs Lab 01/13/14 1345 01/13/14 1648 01/14/14 0650 01/15/14 0243 01/16/14 0612  WBC 9.1 8.3 11.2* 8.8 6.4  HGB 6.8* 6.0* 9.0* 8.4* 8.7*  HCT 25.6* 22.4* 31.3* 29.5* 30.0*  MCV 59.7* 60.1* 65.9* 65.6* 65.8*  PLT 341 304  298 320 355   Cardiac Enzymes: No results found for this basename: CKTOTAL, CKMB, CKMBINDEX, TROPONINI,  in the last 168 hours BNP: BNP (last 3 results) No results found for this basename: PROBNP,  in the last 8760 hours CBG:  Recent Labs Lab 01/15/14 1158 01/15/14 1702 01/15/14 2129 01/15/14 2138 01/16/14 0806  GLUCAP 170* 228* 178* 185* 185*    Time coordinating discharge: 35 minutes  Signed:  Samreen Seltzer  [facility] 01/16/2014, 8:18 AM

## 2014-01-16 NOTE — Progress Notes (Addendum)
[facility] on [address] called to say that they [facility].  The rejection they are getting is "Nonmatch cardholder ID".  I have called and left a voicemail for the patient to call me re: her meds before 4pm today.  [facility] will fill 3 days worth for the patient using [employer] fund.    1500pm:  [facility] and [place].  Ashby DawesGraciela, Spanish interpreter for the hospital, called and spoke with patient on the phone and advised her to [facility] and stop by [facility] unit to pick up 3 days of medications.  On Monday, 7/6, she is to [facility] to fill with M[facility].

## 2014-01-16 NOTE — Progress Notes (Signed)
Lab called.  Blood for CBC draw clotted, will reorder, and continue to monitor patient.

## 2014-01-19 LAB — CULTURE, BLOOD (ROUTINE X 2): CULTURE: NO GROWTH

## 2014-01-20 LAB — CULTURE, BLOOD (SINGLE): Culture: NO GROWTH

## 2014-01-23 NOTE — ED Provider Notes (Signed)
Medical screening examination/treatment/procedure(s) were conducted as a shared visit with non-physician practitioner(s) and myself.  I personally evaluated the patient during the encounter.   EKG Interpretation   Date/Time:  Tuesday January 13 2014 13:06:45 EDT Ventricular Rate:  123 PR Interval:  152 QRS Duration: 86 QT Interval:  306 QTC Calculation: 438 R Axis:   -39 Text Interpretation:  Sinus tachycardia Left axis deviation Minimal  voltage criteria for LVH, may be normal variant T wave abnormality,  consider anterior ischemia Abnormal ECG ED PHYSICIAN INTERPRETATION  AVAILABLE IN [facility] Confirmed by TEST, Record ([id number]) on 01/15/2014  7:04:26 AM       Doug SouSam Jacubowitz, MD 01/23/14 1451

## 2014-01-28 ENCOUNTER — Ambulatory Visit: Payer: Medicaid Other | Attending: Internal Medicine | Admitting: Internal Medicine

## 2014-01-28 VITALS — BP 154/81 | HR 87 | Temp 98.0°F | Resp 17 | Wt 147.2 lb

## 2014-01-28 DIAGNOSIS — E119 Type 2 diabetes mellitus without complications: Secondary | ICD-10-CM | POA: Insufficient documentation

## 2014-01-28 DIAGNOSIS — N390 Urinary tract infection, site not specified: Secondary | ICD-10-CM | POA: Diagnosis not present

## 2014-01-28 DIAGNOSIS — M7989 Other specified soft tissue disorders: Secondary | ICD-10-CM | POA: Insufficient documentation

## 2014-01-28 DIAGNOSIS — E139 Other specified diabetes mellitus without complications: Secondary | ICD-10-CM

## 2014-01-28 DIAGNOSIS — Z794 Long term (current) use of insulin: Secondary | ICD-10-CM | POA: Diagnosis not present

## 2014-01-28 DIAGNOSIS — E089 Diabetes mellitus due to underlying condition without complications: Secondary | ICD-10-CM

## 2014-01-28 LAB — GLUCOSE, POCT (MANUAL RESULT ENTRY): POC GLUCOSE: 123 mg/dL — AB (ref 70–99)

## 2014-01-28 MED ORDER — FUROSEMIDE 40 MG PO TABS: 40.0000 mg | ORAL_TABLET | Freq: Every day | ORAL | Status: DC

## 2014-01-28 NOTE — Progress Notes (Signed)
Patient here for follow up from hospital Was admitted for high blood sugar

## 2014-01-28 NOTE — Patient Instructions (Signed)
Edema periférico °(Peripheral Edema) °Usted sufre hinchazón en las piernas, lo que se denomina edema periférico. Esta hinchazón se debe al exceso de acumulación de sal y agua en el organismo. El edema puede ser un signo de enfermedad cardíaca, renal o hepática, o el efecto secundario de un medicamento. También puede deberse a otros problemas en las venas de las piernas. Si el problema se debe a una circulación venosa deficiente, eleve las piernas y utilice medias especiales de soporte. Evite permanecer de pie durante largos períodos. °El tratamiento depende de la causa. Los chips, pickles y otros alimentos salados deberán evitarse. Casi siempre es necesario restringir la sal en la dieta. Le indicarán diuréticos para eliminar el exceso de sal y agua del organismo por la orina. Estos medicamentos evitan que el riñón absorba el sodio. Aumentan el flujo de orina. °El tratamiento con diuréticos también disminuye los niveles de potasio del organismo. Será necesario que utilice suplementos de potasio si toma diuréticos todos los días. Controle el peso diario para verificar los progresos en la mejoría del edema. Comuníquese con su médico para realizar un control según las indicaciones. °SOLICITE ATENCIÓN MÉDICA DE INMEDIATO SI: °· Aumenta en gran medida la hinchazón, el dolor, la inflamación o el calor en las piernas. °· Le falta el aire, especialmente estando acostado. °· Siente dolor en el pecho o en el abdomen, debilidad, o se marea. °· Tiene fiebre. °Document Released: 07/03/2005 Document Revised: 09/25/2011 °ExitCare® Patient Information ©2015 [employer]. This information is not intended to replace advice given to you by your health care provider. Make sure you discuss any questions you have with your health care provider. ° °

## 2014-01-29 NOTE — Progress Notes (Signed)
Patient ID: Nancy Guerra, female   DOB: July 11, 1971, 43 y.o.   MRN: [id number]   CC: Followup  HPI:  Patient is 43 year old female who presents to clinic for followup. She was recently diagnosed with diabetes and has been taking medicines as prescribed> She has once concern of progressively worsening LE swelling and few lbs weight gain. She denies shortness of breath or chest pain, no orthopnea, no similar events in the past  No Known Allergies Past Medical History  Diagnosis Date  . Diabetes mellitus without complication    Current Outpatient Prescriptions on File Prior to Visit  Medication Sig Dispense Refill  . Acetaminophen (TYLENOL PO) Take 2 tablets by mouth daily as needed (for pain or headache).      . Blood Glucose Monitoring Suppl (BLOOD GLUCOSE METER KIT AND SUPPLIES) Dispense based on patient and insurance preference. Use up to four times daily as directed. (FOR ICD-9 250.00, 250.01).  1 each  0  . ciprofloxacin (CIPRO) 500 MG tablet Take 1 tablet (500 mg total) by mouth 2 (two) times daily.  22 tablet  0  . Emollient (NIVEA) cream Apply 1 application topically as needed for dry skin.      . ferrous sulfate 325 (65 FE) MG tablet Take 1 tablet (325 mg total) by mouth 3 (three) times daily with meals.  90 tablet  0  . insulin aspart (NOVOLOG) 100 UNIT/ML injection Inject 5 Units into the skin 3 (three) times daily before meals.  10 mL  0  . insulin glargine (LANTUS) 100 UNIT/ML injection Inject 0.2 mLs (20 Units total) into the skin daily.  10 mL  0  . Insulin Syringe-Needle U-100 (INSULIN SYRINGE .3CC/31GX5/16") 31G X 5/16" 0.3 ML MISC Use four times daily for insulin injections.  200 each  0   No current facility-administered medications on file prior to visit.   History reviewed. No pertinent family history. History   Social History  . Marital Status: Married    Spouse Name: N/A    Number of Children: N/A  . Years of Education: N/A   Occupational History  . Not  on file.   Social History Main Topics  . Smoking status: Never Smoker   . Smokeless tobacco: Not on file  . Alcohol Use: No  . Drug Use: Not on file  . Sexual Activity: Not on file   Other Topics Concern  . Not on file   Social History Narrative  . No narrative on file    Review of Systems  Constitutional: Negative for fever, chills, diaphoresis, activity change, appetite change and fatigue.  HENT: Negative for ear pain, nosebleeds, congestion, facial swelling, rhinorrhea, neck pain, neck stiffness and ear discharge.   Eyes: Negative for pain, discharge, redness, itching and visual disturbance.  Respiratory: Negative for cough, choking, chest tightness, shortness of breath, wheezing and stridor.   Cardiovascular: Negative for chest pain, palpitations  Gastrointestinal: Negative for abdominal distention.  Genitourinary: Negative for dysuria, urgency, frequency, hematuria, flank pain, decreased urine volume, difficulty urinating and dyspareunia.  Musculoskeletal: Negative for back pain, joint swelling, arthralgias and gait problem.  Neurological: Negative for dizziness, tremors, seizures, syncope, facial asymmetry, speech difficulty, weakness, light-headedness, numbness and headaches.  Hematological: Negative for adenopathy. Does not bruise/bleed easily.  Psychiatric/Behavioral: Negative for hallucinations, behavioral problems, confusion, dysphoric mood, decreased concentration and agitation.    Objective:   Filed Vitals:   01/28/14 1039  BP: 154/81  Pulse: 87  Temp: 98 F (36.7 C)  Resp: 17  Physical Exam  Constitutional: Appears well-developed and well-nourished. No distress.  HENT: Normocephalic. External right and left ear normal. Oropharynx is clear and moist.  Eyes: Conjunctivae and EOM are normal. PERRLA, no scleral icterus.  Neck: Normal ROM. Neck supple. No JVD. No tracheal deviation. No thyromegaly.  CVS: RRR, S1/S2 +, no murmurs, no gallops, no carotid bruit.   Pulmonary: Effort and breath sounds normal, no stridor, rhonchi, wheezes, rales.  Abdominal: Soft. BS +,  no distension, tenderness, rebound or guarding.  Musculoskeletal: Normal range of motion. +1 bilateral lower extremity edema  Lymphadenopathy: No lymphadenopathy noted, cervical, inguinal.   Lab Results  Component Value Date   WBC 6.4 01/16/2014   HGB 8.7* 01/16/2014   HCT 30.0* 01/16/2014   MCV 65.8* 01/16/2014   PLT 355 01/16/2014   Lab Results  Component Value Date   CREATININE 0.29* 01/16/2014   BUN 7 01/16/2014   NA 136* 01/16/2014   K 4.4 01/16/2014   CL 98 01/16/2014   CO2 22 01/16/2014    Lab Results  Component Value Date   HGBA1C 9.2* 01/13/2014   Lipid Panel  No results found for this basename: chol, trig, hdl, cholhdl, vldl, ldlcalc       Assessment and plan:   Recent UTI - Continue ciprofloxacin until completed as previously prescribed by provider Lower strength he edema - No signs of congestive heart failure on exam and lower extremity edema etiology unclear - Will place temporarily on Lasix, patient advised to check weight regularly and to write it down and bring it to our clinic in next week so we can reassess response to Lasix

## 2014-02-04 ENCOUNTER — Ambulatory Visit: Payer: Medicaid Other | Attending: Internal Medicine | Admitting: Internal Medicine

## 2014-02-04 ENCOUNTER — Encounter: Payer: Self-pay | Admitting: Internal Medicine

## 2014-02-04 VITALS — BP 117/77 | HR 78 | Temp 98.3°F | Resp 16 | Ht 60.0 in | Wt 148.0 lb

## 2014-02-04 DIAGNOSIS — Z794 Long term (current) use of insulin: Secondary | ICD-10-CM | POA: Insufficient documentation

## 2014-02-04 DIAGNOSIS — E119 Type 2 diabetes mellitus without complications: Secondary | ICD-10-CM | POA: Insufficient documentation

## 2014-02-04 DIAGNOSIS — E118 Type 2 diabetes mellitus with unspecified complications: Secondary | ICD-10-CM

## 2014-02-04 LAB — GLUCOSE, POCT (MANUAL RESULT ENTRY): POC GLUCOSE: 140 mg/dL — AB (ref 70–99)

## 2014-02-04 MED ORDER — GLUCOSE BLOOD VI STRP: ORAL_STRIP | Status: DC

## 2014-02-04 MED ORDER — FREESTYLE LANCETS MISC: Status: AC

## 2014-02-04 MED ORDER — FREESTYLE LANCETS MISC: Status: DC

## 2014-02-04 NOTE — Progress Notes (Signed)
Pt here to f/u with Diabetes with insulin injections. Pt is taking Novolog 5 units with meals and Lantus 20 units BID. Pt was placed on Lasix 40 mg tablet. Pt is responding well to diuretic . No edema noted,voiding well Pt home weight 145 lbs with 3 pound zone. Denies sob or chest pain States she had 1 episode dizziness Spanish interpretor present

## 2014-02-04 NOTE — Progress Notes (Signed)
Patient ID: Nancy Guerra, female   DOB: 07/23/1970, 43 y.o.   MRN: [id number]  CC:  DM and edema  HPI:  Patient presents today as a follow up for DM and edema.  Patient reports that was on oral antidiabetic medication 2 years ago and quit taking the medication because it made her feel sick.  Patient then did not take any medication for two years and just began insulin two weeks ago.  She is currently taking 5 units of Novolog with meals and 20 units of lantus at bedtime.  Patient BS log reveals CBG's of 99 to 212.  Patient reports that her edema has resolved.  No Known Allergies Past Medical History  Diagnosis Date  . Diabetes mellitus without complication    Current Outpatient Prescriptions on File Prior to Visit  Medication Sig Dispense Refill  . ferrous sulfate 325 (65 FE) MG tablet Take 1 tablet (325 mg total) by mouth 3 (three) times daily with meals.  90 tablet  0  . furosemide (LASIX) 40 MG tablet Take 1 tablet (40 mg total) by mouth daily.  14 tablet  0  . insulin aspart (NOVOLOG) 100 UNIT/ML injection Inject 5 Units into the skin 3 (three) times daily before meals.  10 mL  0  . insulin glargine (LANTUS) 100 UNIT/ML injection Inject 0.2 mLs (20 Units total) into the skin daily.  10 mL  0  . Acetaminophen (TYLENOL PO) Take 2 tablets by mouth daily as needed (for pain or headache).      . Blood Glucose Monitoring Suppl (BLOOD GLUCOSE METER KIT AND SUPPLIES) Dispense based on patient and insurance preference. Use up to four times daily as directed. (FOR ICD-9 250.00, 250.01).  1 each  0  . ciprofloxacin (CIPRO) 500 MG tablet Take 1 tablet (500 mg total) by mouth 2 (two) times daily.  22 tablet  0  . Emollient (NIVEA) cream Apply 1 application topically as needed for dry skin.      . Insulin Syringe-Needle U-100 (INSULIN SYRINGE .3CC/31GX5/16") 31G X 5/16" 0.3 ML MISC Use four times daily for insulin injections.  200 each  0   No current facility-administered medications on file  prior to visit.   History reviewed. No pertinent family history. History   Social History  . Marital Status: Married    Spouse Name: N/A    Number of Children: N/A  . Years of Education: N/A   Occupational History  . Not on file.   Social History Main Topics  . Smoking status: Never Smoker   . Smokeless tobacco: Not on file  . Alcohol Use: No  . Drug Use: Not on file  . Sexual Activity: Not on file   Other Topics Concern  . Not on file   Social History Narrative  . No narrative on file    Review of Systems: Constitutional: Negative for fever, chills, diaphoresis, activity change, appetite change and fatigue. HENT: Negative for ear pain, nosebleeds, congestion, facial swelling, rhinorrhea, neck pain, neck stiffness and ear discharge.  Eyes: Negative for pain, discharge, redness, itching and visual disturbance. Respiratory: Negative for cough, choking, chest tightness, shortness of breath, wheezing and stridor.  Cardiovascular: Negative for chest pain, palpitations and leg swelling. Gastrointestinal: Negative for abdominal distention. Genitourinary: Negative for dysuria, urgency, frequency, hematuria, flank pain, decreased urine volume, difficulty urinating and dyspareunia.  Musculoskeletal: Negative for back pain, joint swelling, arthralgias and gait problem. Neurological: Negative for dizziness, tremors, seizures, syncope, facial asymmetry, speech difficulty, weakness, light-headedness,  numbness and headaches.  Hematological: Negative for adenopathy. Does not bruise/bleed easily. Psychiatric/Behavioral: Negative for hallucinations, behavioral problems, confusion, dysphoric mood, decreased concentration and agitation.    Objective:   Filed Vitals:   02/04/14 1230  BP: 117/77  Pulse: 78  Temp: 98.3 F (36.8 C)  Resp: 16    Physical Exam: Constitutional: Patient appears well-developed and well-nourished. No distress. HENT: Normocephalic, atraumatic, External right  and left ear normal. Oropharynx is clear and moist.  Eyes: Conjunctivae and EOM are normal. PERRLA, no scleral icterus. Neck: Normal ROM. Neck supple. No JVD. No tracheal deviation. No thyromegaly. CVS: RRR, S1/S2 +, no murmurs, no gallops, no carotid bruit.  Pulmonary: Effort and breath sounds normal, no stridor, rhonchi, wheezes, rales.  Abdominal: Soft. BS +,  no distension, tenderness, rebound or guarding.  Musculoskeletal: Normal range of motion. No edema and no tenderness.  Lymphadenopathy: No lymphadenopathy noted, cervical Neuro: Alert. Normal reflexes, muscle tone coordination. No cranial nerve deficit. Skin: Skin is warm and dry. No rash noted. Not diaphoretic. No erythema. No pallor. Psychiatric: Normal mood and affect. Behavior, judgment, thought content normal.  Lab Results  Component Value Date   WBC 6.4 01/16/2014   HGB 8.7* 01/16/2014   HCT 30.0* 01/16/2014   MCV 65.8* 01/16/2014   PLT 355 01/16/2014   Lab Results  Component Value Date   CREATININE 0.29* 01/16/2014   BUN 7 01/16/2014   NA 136* 01/16/2014   K 4.4 01/16/2014   CL 98 01/16/2014   CO2 22 01/16/2014    Lab Results  Component Value Date   HGBA1C 9.2* 01/13/2014   Lipid Panel  No results found for this basename: chol, trig, hdl, cholhdl, vldl, ldlcalc       Assessment and plan:   Nancy Guerra was seen today for follow-up and diabetes.  Diagnoses and associated orders for this visit:  DM (diabetes mellitus) with complications - Glucose (CBG) - Lipid panel; Future - glucose blood test strip; Use as instructed - Lancets (FREESTYLE) lancets; Use as instructed Continue current dose of lantus and Novolog  Due to language barrier, an interpreter was present during the history-taking and subsequent discussion (and for part of the physical exam) with this patient.  Return in about 1 week (around 02/11/2014) for for lab and 2 mo PCP DM.      Nancy Manning, NP-C [facility] [phone] 02/04/2014,  11:58 PM

## 2014-02-04 NOTE — Patient Instructions (Signed)
Hipoglucemia (Hypoglycemia) La hipoglucemia se produce cuando el nivel de glucosa en la sangre es demasiado bajo. La glucosa es un tipo de azcar, que es la principal fuente de energa del cuerpo. Hormonas, como la insulina y el glucagn, controlan el nivel de glucosa en la sangre. La insulina reduce el nivel de la glucosa en la sangre, mientras que el glucagn lo aumenta. Si tiene demasiada insulina en el torrente sanguneo o si no ingiere suficientes alimentos que contengan azcar, puede desarrollar hipoglucemia. Esta afeccin puede manifestarse en personas con o sin diabetes. Puede desarrollarse rpidamente y, como consecuencia, necesitar atencin urgente.  CAUSAS   Omitir o retrasar comidas.  No ingerir demasiados carbohidratos en las comidas.  Consumo excesivo de medicamentos para la diabetes.  No coordinar el horario de la toma de medicamentos por va oral para la diabetes o de insulina, con las comidas, las colaciones y el ejercicio.  Nuseas y vmitos.  Algunos medicamentos.  Enfermedades graves, como hepatitis, trastornos renales y ciertos trastornos de la alimentacin.  Aumento de la actividad fsica o el ejercicio, sin ingerir alimentos adicionales o ajustar los medicamentos.  Beber alcohol en exceso.  Un trastorno nervioso que afecta las funciones corporales, como la frecuencia cardaca, presin arterial y digestin (neuropata autnoma).  Una afeccin en la cual los msculos del estmago no funcionan apropiadamente (gastroparesia). Por consiguiente, los medicamentos y los alimentos no pueden absorberse correctamente.  Pocas veces un tumor de pncreas puede producir demasiada insulina. SNTOMAS   Hambre.  Sudoraciones (diaforesis).  Cambio en la temperatura corporal.  Temblores.  Dolor de cabeza.  Ansiedad.  Aturdimiento.  Irritabilidad.  Dificultad para concentrarse.  Sequedad en la boca.  Hormigueo o adormecimiento de las manos y los pies.  Sueo  agitado o alteraciones del sueo.  Alteracin en el habla y la coordinacin.  Cambio en el estado mental.  Convulsiones breves o prolongadas.  Agresividad  Somnolencia (letargo).  Debilidad.  Aumento de la frecuencia cardaca o palpitaciones.  Confusin.  Piel plida o de tono gris.  Visin borrosa o doble.  Desmayos. DIAGNSTICO  Le harn un examen fsico y una historia clnica. Su mdico puede hacer un diagnstico en funcin de sus sntomas. Pueden realizarle anlisis de sangre y otras pruebas de laboratorio para confirmar el diagnstico. Una vez realizado el diagnstico, su mdico observar si los signos y sntomas desaparecen, una vez que aumenta el nivel de la glucosa en la sangre.  TRATAMIENTO  Por lo general, la hipoglucemia puede tratarse fcilmente cuando se observan sntomas.  Controle su nivel de glucosa en la sangre. Si es menor que 70 mg/dl, tome uno de los siguientes:  3 o 4 comprimidos de glucosa.   taza de jugo.   taza de una gaseosa comn.  1 taza de leche descremada.   a 1 pomo de glucosa en gel.  5 a 6 caramelos duros.  Evite las bebidas o los alimentos con alto contenido de grasa, que pueden retrasar el aumento de los niveles de glucosa en la sangre.  No ingiera ms de la cantidad recomendada de alimentos, bebidas, gel o comprimidos que contengan azcar. Si lo hace, el nivel de glucosa en la sangre subir demasiado.  Espere de 10 a 15 minutos y vuelva a controlar su nivel de glucosa en la sangre. Si an es menor que 70 mg/dl o est por debajo del intervalo indicado, repita el tratamiento.  Ingiera una colacin si falta ms de 1 hora para su prxima comida. Es posible que, alguna vez, su   nivel de glucosa en la sangre baje demasiado, de modo que no pueda tratarse en su casa, cuando comience a observar los sntomas. Probablemente necesite ayuda. Incluso puede desmayarse o ser incapaz de tragar. Si no puede tratarse por s solo, alguien deber  llevarlo al hospital.  INSTRUCCIONES PARA EL CUIDADO EN EL HOGAR  Si tiene diabetes, siga su plan de control de la diabetes:  Tome los medicamentos segn las indicaciones.  Siga el plan de ejercicio.  Siga el plan de comidas. No saltee comidas. Coma a horario.  Controle su nivel de glucosa en la sangre peridicamente. Controle su nivel de glucosa en la sangre antes y despus de ejercitarse. Si hace ejercicio durante ms tiempo o de manera diferente de lo habitual, asegrese de controlar su nivel de glucosa en la sangre con mayor frecuencia.  Use su pulsera o medalla de alerta mdica, que indica que usted tiene diabetes.  Identifique la causa de su hipoglucemia. Luego, desarrolle formas de prevenir la recurrencia de la hipoglucemia.  No tome un bao o una ducha caliente inmediatamente despus de una inyeccin de insulina.  Siempre lleve consigo el tratamiento. Las pastillas de glucosa son fciles de llevar.  Si va a beber alcohol, bbalo solo con las comidas.  Informe a familiares y amigos qu pueden hacer para mantenerlo seguro durante una convulsin. Esto puede incluir retirar objetos duros o filosos del rea o colocarlo de costado.  Mantenga un peso saludable. SOLICITE ATENCIN MDICA SI:   Tiene problemas para mantener el nivel de glucosa en la sangre dentro del intervalo indicado.  Tiene episodios frecuentes de hipoglucemia.  Siente efectos secundarios por los medicamentos prescritos.  No est seguro por qu su nivel de glucosa en la sangre es tan bajo.  Nota cambios o un nuevo problema en la visin . SOLICITE ATENCIN MDICA DE INMEDIATO SI:   Presenta confusin.  Se produce un cambio en su estado mental.  Es incapaz de tragar.  Se desmaya. Document Released: 07/03/2005 Document Revised: 07/08/2013 ExitCare Patient Information 2015 [employer]. This information is not intended to replace advice given to you by your health care provider. Make sure you discuss  any questions you have with your health care provider.  

## 2014-02-11 ENCOUNTER — Ambulatory Visit: Payer: Medicaid Other | Attending: Internal Medicine

## 2014-02-11 DIAGNOSIS — E118 Type 2 diabetes mellitus with unspecified complications: Secondary | ICD-10-CM

## 2014-02-11 LAB — LIPID PANEL
CHOL/HDL RATIO: 2.8 ratio
Cholesterol: 191 mg/dL (ref 0–200)
HDL: 68 mg/dL (ref >39)
LDL CALC: 98 mg/dL (ref 0–99)
TRIGLYCERIDES: 124 mg/dL (ref <150)
VLDL: 25 mg/dL (ref 0–40)

## 2014-03-20 ENCOUNTER — Telehealth: Payer: Self-pay | Admitting: Internal Medicine

## 2014-03-20 ENCOUNTER — Other Ambulatory Visit: Payer: Self-pay | Admitting: Internal Medicine

## 2014-03-20 DIAGNOSIS — E1110 Type 2 diabetes mellitus with ketoacidosis without coma: Secondary | ICD-10-CM

## 2014-03-20 MED ORDER — "INSULIN SYRINGE 31G X 5/16"" 0.3 ML MISC": Status: AC

## 2014-03-20 MED ORDER — INSULIN GLARGINE 100 UNIT/ML ~~LOC~~ SOLN: 20.0000 [IU] | Freq: Every day | SUBCUTANEOUS | Status: DC

## 2014-03-20 MED ORDER — INSULIN ASPART 100 UNIT/ML ~~LOC~~ SOLN: 5.0000 [IU] | Freq: Three times a day (TID) | SUBCUTANEOUS | Status: DC

## 2014-03-20 NOTE — Telephone Encounter (Signed)
Pt. Has ran out of her insulin novolog and lantus, and insulin syringe needle..pt.

## 2014-03-27 ENCOUNTER — Ambulatory Visit: Payer: Medicaid Other | Attending: Internal Medicine | Admitting: Internal Medicine

## 2014-03-27 ENCOUNTER — Encounter: Payer: Self-pay | Admitting: Internal Medicine

## 2014-03-27 VITALS — BP 114/77 | HR 86 | Temp 98.8°F | Resp 16 | Ht 60.0 in | Wt 158.0 lb

## 2014-03-27 DIAGNOSIS — Z794 Long term (current) use of insulin: Secondary | ICD-10-CM | POA: Insufficient documentation

## 2014-03-27 DIAGNOSIS — E119 Type 2 diabetes mellitus without complications: Secondary | ICD-10-CM

## 2014-03-27 LAB — GLUCOSE, POCT (MANUAL RESULT ENTRY): POC Glucose: 134 mg/dl — AB (ref 70–99)

## 2014-03-27 LAB — POCT GLYCOSYLATED HEMOGLOBIN (HGB A1C): Hemoglobin A1C: 7.4

## 2014-03-27 NOTE — Patient Instructions (Signed)
Plan de alimentacin DASH (DASH Eating Plan) DASH es la sigla en ingls de "Enfoques Alimentarios para Detener la Hipertensin". El plan de alimentacin DASH ha demostrado bajar la presin arterial elevada (hipertensin). Los beneficios adicionales para la salud pueden incluir la disminucin del riesgo de diabetes mellitus tipo2, enfermedades cardacas e ictus. Este plan tambin puede ayudar a adelgazar. QU DEBO SABER ACERCA DEL PLAN DE ALIMENTACIN DASH? Para el plan de alimentacin DASH, seguir las siguientes pautas generales:  Elija los alimentos con un valor porcentual diario de sodio de menos del 5% (segn figura en la etiqueta del alimento).  Use hierbas o aderezos sin sal, en lugar de sal de mesa o sal marina.  Consulte al mdico o farmacutico antes de usar sustitutos de la sal.  Coma productos con bajo contenido de sodio, cuya etiqueta suele decir "bajo contenido de sodio" o "sin agregado de sal".  Coma alimentos frescos.  Coma ms verduras, frutas y productos lcteos con bajo contenido de grasas.  Elija los cereales integrales. Busque la palabra "integral" en el primer lugar de la lista de ingredientes.  Elija el pescado y el pollo o el pavo sin piel ms a menudo que las carnes rojas. Limite el consumo de pescado, carne de ave y carne a 6onzas (170g) por da.  Limite el consumo de dulces, postres, azcares y bebidas azucaradas.  Elija las grasas saludables para el corazn.  Limite el consumo de queso a 1onza (28g) por da.  Consuma ms comida casera y menos de restaurante, de buf y comida rpida.  Limite el consumo de alimentos fritos.  Cocine los alimentos utilizando mtodos que no sean la fritura.  Limite las verduras enlatadas. Si las consume, enjuguelas bien para disminuir el sodio.  Cuando coma en un restaurante, pida que preparen su comida con menos sal o, en lo posible, sin nada de sal. QU ALIMENTOS PUEDO COMER? Pida ayuda a un nutricionista para  conocer las necesidades calricas individuales. Cereales Pan de salvado o integral. Arroz integral. Pastas de salvado o integrales. Quinua, trigo burgol y cereales integrales. Cereales con bajo contenido de sodio. Tortillas de harina de maz o de salvado. Pan de maz integral. Galletas saladas integrales. Galletas con bajo contenido de sodio. Vegetales Verduras frescas o congeladas (crudas, al vapor, asadas o grilladas). Jugos de tomate y verduras con contenido bajo o reducido de sodio. Pasta y salsa de tomate con contenido bajo o reducido de sodio. Verduras enlatadas con bajo contenido de sodio o reducido de sodio.  Frutas Frutas frescas, en conserva (en su jugo natural) o frutas congeladas. Carnes y otros productos con protenas Carne de res molida (al 85% o ms magra), carne de res de animales alimentados con pastos o carne de res sin la grasa. Pollo o pavo sin piel. Carne de pollo o de pavo molida. Cerdo sin la grasa. Todos los pescados y frutos de mar. Huevos. Porotos, guisantes o lentejas secos. Frutos secos y semillas sin sal. Frijoles enlatados sin sal. Lcteos Productos lcteos con bajo contenido de grasas, como leche descremada o al 1%, quesos reducidos en grasas o al 2%, ricota con bajo contenido de grasas o queso cottage, o yogur natural con bajo contenido de grasas. Quesos con contenido bajo o reducido de sodio. Grasas y aceites Margarinas en barra que no contengan grasas trans. Mayonesa y alios para ensaladas livianos o reducidos en grasas (reducidos en sodio). [place]. Aceites de crtamo, oliva o canola. Mantequilla natural de man o almendra. Otros Palomitas de maz y pretzels sin sal.   Los artculos mencionados arriba pueden no ser una lista completa de las bebidas o los alimentos recomendados. Comunquese con el nutricionista para conocer ms opciones. QU ALIMENTOS NO SE RECOMIENDAN? Cereales Pan blanco. Pastas blancas. Arroz blanco. Pan de maz refinado. Bagels y  croissants. Galletas saladas que contengan grasas trans. Vegetales Vegetales con crema o fritos. Verduras en salsa de queso. Verduras enlatadas comunes. Pasta y salsa de tomate en lata comunes. Jugos comunes de tomate y de verduras. Frutas Frutas secas. Fruta enlatada en almbar liviano o espeso. Jugo de frutas. Carnes y otros productos con protenas Cortes de carne con grasa. Costillas, alas de pollo, tocineta, salchicha, mortadela, salame, chinchulines, tocino, perros calientes, salchichas alemanas y embutidos envasados. Frutos secos y semillas con sal. Frijoles con sal en lata. Lcteos Leche entera o al 2%, crema, mezcla de leche y crema, y queso crema. Yogur entero o endulzado. Quesos o queso azul con alto contenido de grasas. Cremas no lcteas y coberturas batidas. Quesos procesados, quesos para untar o cuajadas. Condimentos Sal de cebolla y ajo, sal condimentada, sal de mesa y sal marina. Salsas en lata y envasadas. Salsa Worcestershire. Salsa trtara. Salsa barbacoa. Salsa teriyaki. Salsa de soja, incluso la que tiene contenido reducido de sodio. Salsa de carne. Salsa de pescado. Salsa de ostras. Salsa rosada. Rbano picante. Ketchup y mostaza. Saborizantes y tiernizantes para carne. Caldo en cubitos. Salsa picante. Salsa tabasco. Adobos. Aderezos para tacos. Salsas. Grasas y aceites Mantequilla, margarina en barra, manteca de cerdo, grasa, mantequilla clarificada y grasa de tocino. Aceites de coco, de palmiste o de palma. Aderezos comunes para ensalada. Otros Pickles y aceitunas. Palomitas de maz y pretzels con sal. Los artculos mencionados arriba pueden no ser una lista completa de las bebidas y los alimentos que se deben evitar. Comunquese con el nutricionista para obtener ms informacin. DNDE PUEDO ENCONTRAR MS INFORMACIN? [facility] [employer] [facility]az[facility], del Pulmn y de la Sangre (National Heart, Lung, and Blood Institute):  www.nhlbi.nih.gov/health/health-topics/topics/dash/ Document Released: 06/22/2011 Document Revised: 11/17/2013 ExitCare Patient Information 2015 [employer]. This information is not intended to replace advice given to you by your health care provider. Make sure you discuss any questions you have with your health care provider.   

## 2014-03-27 NOTE — Progress Notes (Signed)
Patient ID: Nancy Guerra, female   DOB: 10-09-1970, 43 y.o.   MRN: [id number]  CC: DM follow up  HPI:  Patient presents to clinic today for a follow up of DM, type 2.  She is currently checking her blood sugars three times per day which usually range from 140-160 in AM and lunch is usually less than 100. She reports that she usually spikes again at dinner up to 170. She has been taking medication as prescribed and has had a significant drop in hemoglobin a1c since last visit.    No Known Allergies Past Medical History  Diagnosis Date  . Diabetes mellitus without complication    Current Outpatient Prescriptions on File Prior to Visit  Medication Sig Dispense Refill  . insulin aspart (NOVOLOG) 100 UNIT/ML injection Inject 5 Units into the skin 3 (three) times daily before meals.  10 mL  3  . insulin glargine (LANTUS) 100 UNIT/ML injection Inject 0.2 mLs (20 Units total) into the skin daily.  10 mL  3  . Acetaminophen (TYLENOL PO) Take 2 tablets by mouth daily as needed (for pain or headache).      . Blood Glucose Monitoring Suppl (BLOOD GLUCOSE METER KIT AND SUPPLIES) Dispense based on patient and insurance preference. Use up to four times daily as directed. (FOR ICD-9 250.00, 250.01).  1 each  0  . ciprofloxacin (CIPRO) 500 MG tablet Take 1 tablet (500 mg total) by mouth 2 (two) times daily.  22 tablet  0  . Emollient (NIVEA) cream Apply 1 application topically as needed for dry skin.      . ferrous sulfate 325 (65 FE) MG tablet Take 1 tablet (325 mg total) by mouth 3 (three) times daily with meals.  90 tablet  0  . furosemide (LASIX) 40 MG tablet Take 1 tablet (40 mg total) by mouth daily.  14 tablet  0  . glucose blood test strip Use as instructed  100 each  12  . Insulin Syringe-Needle U-100 (INSULIN SYRINGE .3CC/31GX5/16") 31G X 5/16" 0.3 ML MISC Use four times daily for insulin injections.  100 each  3  . Lancets (FREESTYLE) lancets Use as instructed  100 each  12   No current  facility-administered medications on file prior to visit.   History reviewed. No pertinent family history. History   Social History  . Marital Status: Married    Spouse Name: N/A    Number of Children: N/A  . Years of Education: N/A   Occupational History  . Not on file.   Social History Main Topics  . Smoking status: Never Smoker   . Smokeless tobacco: Not on file  . Alcohol Use: No  . Drug Use: Not on file  . Sexual Activity: Not on file   Other Topics Concern  . Not on file   Social History Narrative  . No narrative on file   Review of Systems  Constitutional: Negative for malaise/fatigue.  Eyes: Negative for blurred vision.  Respiratory: Negative.   Cardiovascular: Positive for leg swelling. Negative for chest pain and palpitations.  Genitourinary: Negative for urgency and frequency.  Neurological: Positive for tingling (pinching sensation of feet). Negative for dizziness and headaches.  Endo/Heme/Allergies: Negative for polydipsia.      Objective:   Filed Vitals:   03/27/14 1606  BP: 114/77  Pulse: 86  Temp: 98.8 F (37.1 C)  Resp: 16    Physical Exam: Constitutional: Patient appears well-developed and well-nourished. No distress. HENT: Normocephalic, atraumatic, External right  and left ear normal. Oropharynx is clear and moist.  Eyes: Conjunctivae and EOM are normal. PERRLA, no scleral icterus. Neck: Normal ROM. Neck supple. No JVD. No tracheal deviation. No thyromegaly. CVS: RRR, S1/S2 +, no murmurs, no gallops, no carotid bruit.  Pulmonary: Effort and breath sounds normal, no stridor, rhonchi, wheezes, rales.  Abdominal: Soft. BS +,  no distension, tenderness, rebound or guarding.  Musculoskeletal: Normal range of motion. No edema and no tenderness.  Lymphadenopathy: No lymphadenopathy noted, cervical Neuro: Alert. Normal reflexes, muscle tone coordination. No cranial nerve deficit. Skin: Skin is warm and dry. No rash noted. Not diaphoretic. No  erythema. No pallor. Psychiatric: Normal mood and affect. Behavior, judgment, thought content normal.  Lab Results  Component Value Date   WBC 6.4 01/16/2014   HGB 8.7* 01/16/2014   HCT 30.0* 01/16/2014   MCV 65.8* 01/16/2014   PLT 355 01/16/2014   Lab Results  Component Value Date   CREATININE 0.29* 01/16/2014   BUN 7 01/16/2014   NA 136* 01/16/2014   K 4.4 01/16/2014   CL 98 01/16/2014   CO2 22 01/16/2014    Lab Results  Component Value Date   HGBA1C 7.4 03/27/2014   Lipid Panel     Component Value Date/Time   CHOL 191 02/11/2014 0919   TRIG 124 02/11/2014 0919   HDL 68 02/11/2014 0919   CHOLHDL 2.8 02/11/2014 0919   VLDL 25 02/11/2014 0919   LDLCALC 98 02/11/2014 0919       Assessment and plan:   Nancy Guerra was seen today for follow-up.  Diagnoses and associated orders for this visit:  Type 2 diabetes mellitus without complication - Glucose (CBG) - HgB A1c - Microalbumin, urine  Due to language barrier, an interpreter was present during the history-taking and subsequent discussion (and for part of the physical exam) with this patient.   Return in about 3 months (around 06/26/2014) for Diabetes Mellitus.    Chari Manning, NP-C [facility] [phone] 04/13/2014, 3:54 PM

## 2014-03-27 NOTE — Progress Notes (Signed)
Pt is here following up on her diabetes. 

## 2014-03-28 LAB — MICROALBUMIN, URINE: Microalb, Ur: 0.55 mg/dL (ref 0.00–1.89)

## 2014-03-31 ENCOUNTER — Other Ambulatory Visit: Payer: Self-pay | Admitting: Internal Medicine

## 2014-03-31 DIAGNOSIS — E1110 Type 2 diabetes mellitus with ketoacidosis without coma: Secondary | ICD-10-CM

## 2014-03-31 MED ORDER — INSULIN ASPART 100 UNIT/ML ~~LOC~~ SOLN: 5.0000 [IU] | Freq: Three times a day (TID) | SUBCUTANEOUS | Status: DC

## 2014-03-31 MED ORDER — INSULIN GLARGINE 100 UNIT/ML ~~LOC~~ SOLN: 20.0000 [IU] | Freq: Every day | SUBCUTANEOUS | Status: DC

## 2014-04-06 ENCOUNTER — Ambulatory Visit: Payer: Self-pay

## 2014-09-18 ENCOUNTER — Ambulatory Visit: Payer: Self-pay | Attending: Internal Medicine | Admitting: Internal Medicine

## 2014-09-18 ENCOUNTER — Encounter: Payer: Self-pay | Admitting: Internal Medicine

## 2014-09-18 VITALS — BP 128/70 | HR 99 | Temp 98.8°F | Resp 16 | Wt 156.0 lb

## 2014-09-18 DIAGNOSIS — IMO0002 Reserved for concepts with insufficient information to code with codable children: Secondary | ICD-10-CM

## 2014-09-18 DIAGNOSIS — Z79899 Other long term (current) drug therapy: Secondary | ICD-10-CM | POA: Insufficient documentation

## 2014-09-18 DIAGNOSIS — E119 Type 2 diabetes mellitus without complications: Secondary | ICD-10-CM | POA: Insufficient documentation

## 2014-09-18 DIAGNOSIS — Z794 Long term (current) use of insulin: Secondary | ICD-10-CM | POA: Insufficient documentation

## 2014-09-18 DIAGNOSIS — E1165 Type 2 diabetes mellitus with hyperglycemia: Secondary | ICD-10-CM

## 2014-09-18 LAB — POCT GLYCOSYLATED HEMOGLOBIN (HGB A1C): Hemoglobin A1C: 10.4

## 2014-09-18 MED ORDER — INSULIN ASPART 100 UNIT/ML ~~LOC~~ SOLN: 5.0000 [IU] | Freq: Three times a day (TID) | SUBCUTANEOUS | Status: DC

## 2014-09-18 MED ORDER — INSULIN GLARGINE 100 UNIT/ML ~~LOC~~ SOLN: 20.0000 [IU] | Freq: Every day | SUBCUTANEOUS | Status: DC

## 2014-09-18 NOTE — Progress Notes (Signed)
Patient here with in house interpreter Here for follow up on her diabetes and medication refills

## 2014-09-18 NOTE — Progress Notes (Signed)
Patient ID: Nancy Guerra, female   DOB: Dec 14, 1970, 44 y.o.   MRN: [id number] SUBJECTIVE: 44 y.o. female for follow up of diabetes. Diabetic Review of Systems - medication compliance: probably noncompliant though I cannot elicit that specific history, diabetic diet compliance: probably noncompliant though I cannot elicit that specific history, home glucose monitoring: is not performed, acute symptoms are BLE neuropathy at night and blurred vision.  She faj Was out of insulin in January and has not been able to get her sugars under control since then.  Current Outpatient Prescriptions  Medication Sig Dispense Refill  . Blood Glucose Monitoring Suppl (BLOOD GLUCOSE METER KIT AND SUPPLIES) Dispense based on patient and insurance preference. Use up to four times daily as directed. (FOR ICD-9 250.00, 250.01). 1 each 0  . ferrous sulfate 325 (65 FE) MG tablet Take 1 tablet (325 mg total) by mouth 3 (three) times daily with meals. 90 tablet 0  . glucose blood test strip Use as instructed 100 each 12  . insulin aspart (NOVOLOG) 100 UNIT/ML injection Inject 5 Units into the skin 3 (three) times daily before meals. 20 mL 3  . insulin glargine (LANTUS) 100 UNIT/ML injection Inject 0.2 mLs (20 Units total) into the skin daily. 15 mL 4  . Insulin Syringe-Needle U-100 (INSULIN SYRINGE .3CC/31GX5/16") 31G X 5/16" 0.3 ML MISC Use four times daily for insulin injections. 100 each 3  . Lancets (FREESTYLE) lancets Use as instructed 100 each 12   No current facility-administered medications for this visit.    OBJECTIVE: Appearance: alert, well appearing, and in no distress. BP 151/89 mmHg  Pulse 99  Temp(Src) 98.8 F (37.1 C)  Resp 16  Wt 156 lb (70.761 kg)  SpO2 100%  Exam: heart sounds normal rate, regular rhythm, normal S1, S2, no murmurs, rubs, clicks or gallops, normal bilateral carotid upstroke without bruits, no JVD, chest clear, no carotid bruits, feet: warm, good capillary refill, normal DP and  PT pulses, normal monofilament exam and normal sensory exam  ASSESSMENT: Diabetes Mellitus: poorly controlled  PLAN: See orders for this visit as documented in the electronic medical record. Issues reviewed with her: low cholesterol diet, weight control and daily exercise discussed, home glucose monitoring demonstrated and taught, all medications, side effects and compliance discussed carefully, foot care discussed and Podiatry visits discussed, annual eye examinations at Ophthalmology discussed and long term diabetic complications discussed.  Nancy Guerra was seen today for follow-up.  Diagnoses and all orders for this visit:  DM (diabetes mellitus), type 2, uncontrolled Orders: -     Glucose (CBG) -     HgB A1c -     insulin aspart (NOVOLOG) 100 UNIT/ML injection; Inject 5 Units into the skin 3 (three) times daily before meals. -     insulin glargine (LANTUS) 100 UNIT/ML injection; Inject 0.2 mLs (20 Units total) into the skin daily. Patients diabetes remains uncontrolled as evidence by hemoglobin a1c >8.  Patient has been non-compliant with medication regimen. Stressed the multiple complications associated with uncontrolled diabetes.  Patient will stay on current medication dose and report back to clinic with cbg log in 2 weeks.   Return in about 2 weeks (around 10/02/2014) for Nurse Visit-Cbg log.  Chari Manning, NP 09/28/2014 6:18 PM

## 2014-09-18 NOTE — Patient Instructions (Signed)
Diabetes y normas bsicas de atencin mdica (Diabetes and Standards of Medical Care) La diabetes es una enfermedad complicada. El equipo que trate su diabetes deber incluir un nutricionista, un enfermero, un educador para la diabetes, un oftalmlogo y ms. Para que todas las personas conozcan sobre su enfermedad y para que los pacientes tengan los cuidados que necesitan, se crearon las siguientes normas bsicas para un mejor control. A continuacin se indican los estudios, vacunas, medicamentos, educacin y planes que necesitar. Prueba de HbA1c Esta prueba muestra cmo ha sido controlada su glucosa en los ltimos 2 o 3 meses. Se utiliza para verificar si el plan de control de la diabetes debe ser ajustado.   Hgalos al menos 2 veces al ao si cumple los objetivos del tratamiento.  Si le han cambiado el tratamiento o si no cumple con los objetivos del tratamiento, debe hacerlo 4 veces al ao. Control de la presin arterial.  Hgalas en cada visita mdica de rutina. El objetivo es tener menos de 140/90 mm Hg en la mayora de las personas, pero 130/80 mm Hg en algunos casos. Consulte a su mdico acerca de su objetivo. Examen dental.  Concurra regularmente a las visitas de control con el dentista. Examen ocular.  Si le diagnosticaron diabetes tipo 1 siendo un nio, debe hacerse estudios al llegar a los 10 aos o ms y si ha sufrido de diabetes durante 3 a 5 aos. Se recomienda hacer anualmente los exmenes oculares despus de ese examen inicial.  Si le diagnosticaron diabetes tipo 1 siendo adulto, hgase un examen dentro de los 5 aos del diagnstico y luego una vez por ao.  Si le diagnosticaron diabetes tipo 2, hgase un estudio lo antes posible despus del diagnstico y luego una vez por ao. Examen de los pies  Se har una inspeccin visual en cada visita mdica de rutina. Estos controles observarn si hay cortes, lesiones u otros problemas en los pies.  Una vez por ao debe hacerse un  examen ms intensivo. Este examen incluye la inspeccin visual y una evaluacin de los pulsos del pie y la sensibilidad.  Contrlese los pies todas las noches para ver si hay cortes, lesiones u otros problemas. Comunquese con su mdico si observa que no se curan. Estudio de la funcin renal (microalbmina en orina)  Debe realizarse una vez por ao.  Diabetes tipo 1: La primera prueba se realiza a los 5 aos despus del diagnstico.  Diabetes tipo2: La primera prueba se realiza en el momento del diagnstico.  La creatinina srica y el ndice de filtracin glomerular estimada (eGFR, por sus siglas en ingls) se realizan una vez por ao para informar el nivel de enfermedad renal crnica, si la hubiera. Perfil lipdico (colesterol, HDL, LDL, triglicridos).  La mayora de las personas lo hacen cada 5 aos.  En relacin al LDL, el objetivo es tener menos de 100 mg/dl. Si tiene alto riesgo, el objetivo es tener menos de 70 mg/dl.  En relacin al HDL, el objetivo es tener entre 40 y 50 mg/dl para los hombres y entre 50 y 60 mg/dl para las mujeres. Un nivel de colesterol HDL de 60 mg/dl o superior da una cierta proteccin contra la enfermedad cardaca.  En relacin a los triglicridos, el objetivo es tener menos de 150 mg/dl. Vacuna contra la gripe, contra el neumococo y contra la hepatitis B  Se recomienda aplicarse la vacuna contra la gripe anualmente.  Se recomienda que las personas mayores de 65aos con diabetes se apliquen la   vacuna contra la neumona. En algunos casos, pueden administrarse dos inyecciones separadas. Pregntele al mdico si tiene la vacuna contra la neumona al da.  Tambin se recomienda la aplicacin de la vacuna contra la hepatitis B en los adultos con diabetes. Educacin para el autocontrol de la diabetes  Recomendaciones al momento del diagnstico y los controles segn sea necesario. Plan de tratamiento  Su plan de tratamiento ser revisado en cada visita  mdica. Document Released: 09/27/2009 Document Revised: 11/17/2013 [facility] Patient Information 2015 [place]. This information is not intended to replace advice given to you by your health care provider. Make sure you discuss any questions you have with your health care provider.

## 2014-09-19 ENCOUNTER — Other Ambulatory Visit: Payer: Self-pay | Admitting: Internal Medicine

## 2014-09-28 DIAGNOSIS — IMO0002 Reserved for concepts with insufficient information to code with codable children: Secondary | ICD-10-CM | POA: Insufficient documentation

## 2014-09-28 DIAGNOSIS — E1165 Type 2 diabetes mellitus with hyperglycemia: Secondary | ICD-10-CM | POA: Insufficient documentation

## 2014-10-08 ENCOUNTER — Ambulatory Visit: Payer: Self-pay | Attending: Internal Medicine | Admitting: *Deleted

## 2014-10-08 VITALS — BP 122/77 | HR 93 | Temp 99.5°F | Resp 18

## 2014-10-08 DIAGNOSIS — IMO0002 Reserved for concepts with insufficient information to code with codable children: Secondary | ICD-10-CM

## 2014-10-08 DIAGNOSIS — E1165 Type 2 diabetes mellitus with hyperglycemia: Secondary | ICD-10-CM | POA: Insufficient documentation

## 2014-10-08 LAB — GLUCOSE, POCT (MANUAL RESULT ENTRY): POC Glucose: 148 mg/dl — AB (ref 70–99)

## 2014-10-08 MED ORDER — INSULIN GLARGINE 100 UNIT/ML ~~LOC~~ SOLN: 25.0000 [IU] | Freq: Every day | SUBCUTANEOUS | Status: DC

## 2014-10-08 NOTE — Progress Notes (Signed)
Spoke with patient via [employer]pre[employer],  [place], [place] and [employer] Interpreter, [place] Patient presents for CBG and record review. Patient brought glucometer which is not to the correct date or time; log book given. Patient instructed on use and to bring to all future appts. Med list reviewed; patient reports taking all meds as directed Patient's AM fasting blood sugars ranging 200s-300s  CBG 148  BP 122/77 P 93 R 18 T 99.5 oral SpO2 99%  Per PCP: Increase lantus to 25 units daily (Patient takes in AM due to work schedule)  Patient to return in 2 weeks for nurse visit for CBG and record review

## 2014-10-14 ENCOUNTER — Other Ambulatory Visit: Payer: Self-pay | Admitting: Internal Medicine

## 2014-10-14 DIAGNOSIS — E1165 Type 2 diabetes mellitus with hyperglycemia: Secondary | ICD-10-CM

## 2014-10-14 DIAGNOSIS — IMO0002 Reserved for concepts with insufficient information to code with codable children: Secondary | ICD-10-CM

## 2014-10-14 MED ORDER — INSULIN ASPART 100 UNIT/ML FLEXPEN: 5.0000 [IU] | PEN_INJECTOR | Freq: Two times a day (BID) | SUBCUTANEOUS | Status: DC

## 2014-10-27 ENCOUNTER — Ambulatory Visit: Payer: Self-pay | Attending: Internal Medicine | Admitting: *Deleted

## 2014-10-27 VITALS — BP 118/72 | HR 93 | Temp 98.7°F | Resp 20

## 2014-10-27 DIAGNOSIS — E1165 Type 2 diabetes mellitus with hyperglycemia: Secondary | ICD-10-CM | POA: Insufficient documentation

## 2014-10-27 DIAGNOSIS — IMO0002 Reserved for concepts with insufficient information to code with codable children: Secondary | ICD-10-CM

## 2014-10-27 DIAGNOSIS — Z794 Long term (current) use of insulin: Secondary | ICD-10-CM | POA: Insufficient documentation

## 2014-10-27 LAB — GLUCOSE, POCT (MANUAL RESULT ENTRY): POC Glucose: 163 mg/dl — AB (ref 70–99)

## 2014-10-27 MED ORDER — FERROUS SULFATE 325 (65 FE) MG PO TABS: 325.0000 mg | ORAL_TABLET | Freq: Three times a day (TID) | ORAL | Status: AC

## 2014-10-27 MED ORDER — INSULIN GLARGINE 100 UNIT/ML ~~LOC~~ SOLN: 25.0000 [IU] | Freq: Every day | SUBCUTANEOUS | Status: AC

## 2014-10-27 MED ORDER — INSULIN ASPART 100 UNIT/ML FLEXPEN: 5.0000 [IU] | PEN_INJECTOR | Freq: Two times a day (BID) | SUBCUTANEOUS | Status: AC

## 2014-10-27 NOTE — Progress Notes (Signed)
Spoke with patient via [employer],  [place], [place] [phone] and In-house Interpreter, Jomarie [place] Patient presents for CBG and record review for T2DM Med list reviewed; patient reports taking novolog 5 units tid before meals and lantus 25 units q AM.  Has not been taken  iron Patient's AM fasting blood sugars ranging 195-244 Patient's before lunch blood sugars ranging 109-201 Patient's before dinner blood sugars ranging 140-201 Walking 30 minutes daily for last 6 weeks, however, states often does not feel like walking due to fatigue. Discussed restarting iron supplement   CBG 163 2 hours after meal  BP 118/72 P 93 R 20 T 98.7 oral SpO2 98%  Per Medical Director: No changes to meds at this time Concentrate on diet and exercise  Refill for ferrous sulfate, novolg and lantus  [facility]  Patient aware that she is to f/u with PCP 3 months from last visit. Due 12/19/2014  Patient instructed to schedule nurse visit if noticing BS trending up  Patient given literature on Diabetes and Food, Diabetes and Exercise, Basic Carb Counting and The Plate Method

## 2014-10-27 NOTE — Patient Instructions (Signed)
La diabetes mellitus y los alimentos (Diabetes Mellitus and Food) Es importante que controle su nivel de azcar en la sangre (glucosa). El nivel de glucosa en sangre depende en gran medida de lo que usted come. Comer alimentos saludables en las cantidades adecuadas a lo largo del da, aproximadamente a la misma hora todos los das, lo ayudar a controlar su nivel de glucosa en sangre. Tambin puede ayudarlo a retrasar o evitar el empeoramiento de la diabetes mellitus. Comer de manera saludable incluso puede ayudarlo a mejorar el nivel de presin arterial y a alcanzar o mantener un peso saludable.  CMO PUEDEN AFECTARME LOS ALIMENTOS? Carbohidratos Los carbohidratos afectan el nivel de glucosa en sangre ms que cualquier otro tipo de alimento. El nutricionista lo ayudar a determinar cuntos carbohidratos puede consumir en cada comida y ensearle a contarlos. El recuento de carbohidratos es importante para mantener la glucosa en sangre en un nivel saludable, en especial si utiliza insulina o toma determinados medicamentos para la diabetes mellitus. Alcohol El alcohol puede provocar disminuciones sbitas de la glucosa en sangre (hipoglucemia), en especial si utiliza insulina o toma determinados medicamentos para la diabetes mellitus. La hipoglucemia es una afeccin que puede poner en peligro la vida. Los sntomas de la hipoglucemia (somnolencia, mareos y desorientacin) son similares a los sntomas de haber consumido mucho alcohol.  Si el mdico lo autoriza a beber alcohol, hgalo con moderacin y siga estas pautas:  Las mujeres no deben beber ms de un trago por da, y los hombres no deben beber ms de dos tragos por da. Un trago es igual a:  12 onzas (355 ml) de cerveza  5 onzas de vino (150 ml) de vino  1,5onzas (45ml) de bebidas espirituosas  No beba con el estmago vaco.  Mantngase hidratado. Beba agua, gaseosas dietticas o t helado sin azcar.  Las gaseosas comunes, los jugos y  otros refrescos podran contener muchos carbohidratos y se deben contar. QU ALIMENTOS NO SE RECOMIENDAN? Cuando haga las elecciones de alimentos, es importante que recuerde que todos los alimentos son distintos. Algunos tienen menos nutrientes que otros por porcin, aunque podran tener la misma cantidad de caloras o carbohidratos. Es difcil darle al cuerpo lo que necesita cuando consume alimentos con menos nutrientes. Estos son algunos ejemplos de alimentos que debera evitar ya que contienen muchas caloras y carbohidratos, pero pocos nutrientes:  Grasas trans (la mayora de los alimentos procesados incluyen grasas trans en la etiqueta de Informacin nutricional).  Gaseosas comunes.  Jugos.  Caramelos.  Dulces, como tortas, pasteles, rosquillas y galletas.  Comidas fritas. QU ALIMENTOS PUEDO COMER? Consuma alimentos ricos en nutrientes, que nutrirn el cuerpo y lo mantendrn saludable. Los alimentos que debe comer tambin dependern de varios factores, como:  Las caloras que necesita.  Los medicamentos que toma.  Su peso.  El nivel de glucosa en sangre.  El nivel de presin arterial.  El nivel de colesterol. Tambin debe consumir una variedad de alimentos, como:  Protenas, como carne, aves, pescado, tofu, frutos secos y semillas (las protenas de animales magros son mejores).  Frutas.  Verduras.  Productos lcteos, como leche, queso y yogur (descremados son mejores).  Panes, granos, pastas, cereales, arroz y frijoles.  Grasas, como aceite de oliva, margarina sin grasas trans, aceite de canola, aguacate y aceitunas. TODOS LOS QUE PADECEN DIABETES MELLITUS TIENEN EL MISMO PLAN DE COMIDAS? Dado que todas las personas que padecen diabetes mellitus son distintas, no hay un solo plan de comidas que funcione para todos. Es muy   importante que se rena con un nutricionista que lo ayudar a crear un plan de comidas adecuado para usted. Document Released: 10/10/2007  Document Revised: 07/08/2013 [employer] Patient Information 2015 [employer]. This information is not intended to replace advice given to you by your health care provider. Make sure you discuss any questions you have with your health care provider. Diabetes y actividad fsica (Diabetes and Exercise) Hacer actividad fsica con regularidad es muy importante. No se trata solo de perder peso. Tiene muchos otros beneficios, como por ejemplo:  Mejorar el estado fsico, la flexibilidad y la resistencia.  Aumenta la densidad sea.  Ayuda a controlar el peso.  Disminuye la grasa corporal.  Aumenta la fuerza muscular.  Reduce el estrs y las tensiones.  Mejora el estado de salud general. Las personas diabticas que realizan actividad fsica tienen beneficios adicionales debido al ejercicio:  Reduce el apetito.  El organismo mejora el uso del azcar (glucosa) de la sangre.  Ayuda a disminuir o controlar la glucosa en la sangre.  Disminuye la presin arterial.  Ayuda a disminuir los lpidos en la sangre (colesterol y triglicridos).  El organismo mejora el uso de la insulina porque:  Aumenta la sensibilidad del organismo a la insulina.  Reduce las necesidades de insulina del organismo.  Disminuye el riesgo de enfermedad cardaca por la actividad fsica ya que  disminuye el colesterol y tambin los triglicridos.  Aumenta los niveles de colesterol bueno (como las lipoprotenas de alta densidad [HDL]) en el organismo.  Disminuye los niveles de glucosa en la sangre. SU PLAN DE ACTIVIDAD  Elija una actividad que disfrute y establezca objetivos realistas. Su mdico o educador en diabetes podrn ayudarlo a encontrar una actividad que lo beneficie. Haga ejercicio regularmente como se lo haya indicado el mdico. Esto incluye:  Hacer entrenamiento de resistencia dos veces a la semana, como flexiones, sentadillas, levantar peso o usar bandas de resistencia.  Practicar 150minutos de  ejercicios cardiovasculares cada semana, como caminar, correr o hacer algn deporte.  Mantenerse activo y no permanecer inactivo durante ms de 90minutos seguidos. Los perodos cortos de actividad tambin son beneficiosos. Tres sesiones de 10minutos a lo largo del da son tan beneficiosas como una sola sesin de 30minutos. Estas son algunas ideas para los ejercicios:  Lleve a pasear el perro.  Utilice las escaleras en lugar del ascensor.  Baile su cancin favorita.  Haga los ejercicios de un video de ejercicios.  Haga sus ejercicios favoritos con un amigo. RECOMENDACIONES PARA REALIZAR EJERCICIOS CUANDO SE TIENE DIABETES TIPO 1 O TIPO 2   Controle la glucosa en la sangre antes de comenzar. Si el nivel de glucosa en la sangre es de ms de 240 mg/dl, controle las cetonas en la orina. No haga actividad fsica si hay cetonas.  Evite inyectarse insulina en las zonas del cuerpo que ejercitar. Por ejemplo, evite inyectarse insulina en:  Los brazos, si juega al tenis.  Las piernas, si corre.  Lleve un registro de:  Los alimentos que consume antes y despus de hacer el ejercicio.  Los momentos esperables de picos de accin de la insulina.  Los niveles de glucosa en la sangre antes y despus de hacer ejercicios.  El tipo y cantidad de actividad fsica que realiza.  Revise los registros con su mdico. El mdico lo ayudar a desarrollar pautas para ajustar la cantidad de alimento y las cantidades de insulina antes y despus de hacer ejercicios.  Si toma insulina o agentes hipoglucemiantes por va oral, observe si hay   signos y sntomas de hipoglucemia. Entre los que se incluyen:  Mareos.  Temblores.  Sudoracin.  Escalofros.  Confusin.  Beba gran cantidad de agua mientras hace ejercicios para evitar la deshidratacin o los golpes de calor. Durante la actividad fsica se pierde agua corporal que se debe reponer.  Comente con su mdico antes de comenzar un programa de  actividad fsica para verificar que sea seguro para usted. Recuerde, cualquier actividad es mejor que ninguna. Document Released: 07/23/2007 Document Revised: 11/17/2013 [employer] Patient Information 2015 [employer]. This information is not intended to replace advice given to you by your health care provider. Make sure you discuss any questions you have with your health care provider. Recuento bsico de carbohidratos para la diabetes mellitus (Basic Carbohydrate Counting for Diabetes Mellitus) El recuento de carbohidratos es un mtodo destinado a calcular la cantidad de carbohidratos en la dieta. El consumo de carbohidratos aumenta naturalmente el nivel de azcar (glucosa) en la sangre, por lo que es importante que sepa la cantidad que debe incluir en cada comida. El recuento de carbohidratos ayuda a mantener el nivel de glucosa en la sangre dentro de los lmites normales. La cantidad permitida de carbohidratos es diferente para cada persona. Un nutricionista puede ayudarlo a calcular la cantidad adecuada para usted. Una vez que sepa la cantidad de carbohidratos que puede consumir, podr calcular los carbohidratos de los alimentos que desea comer. Los siguientes alimentos incluyen carbohidratos:  Granos, como panes y cereales.  Frijoles secos y productos con soja.  Vegetales almidonados, como papas, guisantes y maz.  Frutas y jugos de frutas.  Leche y yogur.  Dulces y bocadillos, como pastel, galletas, caramelos, papas fritas de bolsa, refrescos y bebidas frutales con azcar. RECUENTO DE CARBOHIDRATOS Hay dos maneras de calcular los carbohidratos de los alimentos. Puede usar cualquiera de los dos mtodos o una combinacin de ambos. Leer la etiqueta de informacin nutricional de los alimentos envasados La informacin nutricional es una etiqueta incluida en casi todas las bebidas y los alimentos envasados de los [place]. Indica el tamao de la porcin de ese alimento o bebida e  informacin sobre los nutrientes de cada porcin, incluso los gramos (g) de carbohidratos por porcin.  Decida la cantidad de porciones que comer o tomar de este alimento o bebida. Multiplique la cantidad de porciones por el nmero de gramos de carbohidratos indicados en la etiqueta para esa porcin. El total ser la cantidad de carbohidratos que consumir al comer ese alimento o tomar esa bebida. Conocer las porciones estndar de los alimentos Cuando coma alimentos no envasados o que no incluyan la informacin nutricional en la etiqueta, deber medir las porciones para poder calcular la cantidad de carbohidratos. Una porcin de la mayora de los alimentos ricos en carbohidratos contiene alrededor de 15g de carbohidratos. La siguiente lista incluye los tamaos de porcin de los alimentos ricos en carbohidratos que contienen alrededor de 15g de carbohidratos por porcin:   1rebanada de pan (1oz) o 1tortilla de seis pulgadas.  panecillo de hamburguesa o bollito tipo ingls.  4a 6galletas.   de taza de cereal sin azcar y seco.   taza de cereal caliente.   de taza de arroz o pastas.  taza de pur de papas o de una papa grande al horno.  1taza de frutas frescas o una fruta pequea.  taza de frutas o jugo de frutas enlatados o congelados.  1 taza de leche.   de taza de yogur descremado sin ningn agregado o de yogur endulzado con   edulcorante artificial.  taza de vegetales almidonados, como guisantes, maz o papas, o de frijoles secos cocidos. Decida la cantidad de porciones estndar que comer. Multiplique la cantidad de porciones por 15 (los gramos de carbohidratos en esa porcin). Por ejemplo, si come 2tazas de fresas, habr comido 2porciones y 30g de carbohidratos (2porciones x 15g = 30g). Para las comidas como sopas y guisos, en las que se mezcla ms de un alimento, deber contar los carbohidratos de cada alimento incluido. EJEMPLO DE RECUENTO DE  CARBOHIDRATOS Ejemplo de cena  3 onzas de pechugas de pollo.   de taza de arroz integral.   taza de maz.  1 taza de leche.  1 taza de fresas con crema batida sin azcar. Clculo de carbohidratos [place] 1: Identifique los alimentos que contienen carbohidratos:   Arroz.  Maz.  Leche.  Fresas. [place] 2: Calcule el nmero de porciones que consumir de cada uno:   2 porciones de arroz.  1 porcin de maz.  1 porcin de leche.  1 porcin de fresas. [place] 3: Multiplique cada una de esas porciones por 15g:   2 porciones de arroz x 15 g = 30 g.  1 porcin de maz x 15 g = 15 g.  1 porcin de leche x 15 g = 15 g.  1 porcin de fresas x 15 g = 15 g. Paso 4: Sume todas las cantidades para conocer el total de gramos de carbohidratos consumidos: 30 g + 15 g + 15 g + 15 g = 75 g. Document Released: 09/25/2011 Document Revised: 11/17/2013 [employer] Patient Information 2015 [employer]. This information is not intended to replace advice given to you by your health care provider. Make sure you discuss any questions you have with your health care provider.  

## 2014-12-21 ENCOUNTER — Ambulatory Visit: Payer: Self-pay | Admitting: Internal Medicine

## 2016-06-05 IMAGING — US US PELVIS COMPLETE
1 series · 14 of 25 positions shown · non-contrast
Comparison: None

CLINICAL DATA: Anemia.  History of diabetes.

EXAM:
TRANSABDOMINAL AND TRANSVAGINAL ULTRASOUND OF PELVIS
TECHNIQUE: Both transabdominal and transvaginal ultrasound examinations of the
pelvis were performed. Transabdominal technique was performed for
global imaging of the pelvis including uterus, ovaries, adnexal
regions, and pelvic cul-de-sac. It was necessary to proceed with
endovaginal exam following the transabdominal exam to visualize the
endometrium and ovaries to better advantage..

[Series 1: us pelvis complete · 0.25mm/px · 14 of 77 slices shown]
[im 1/77]
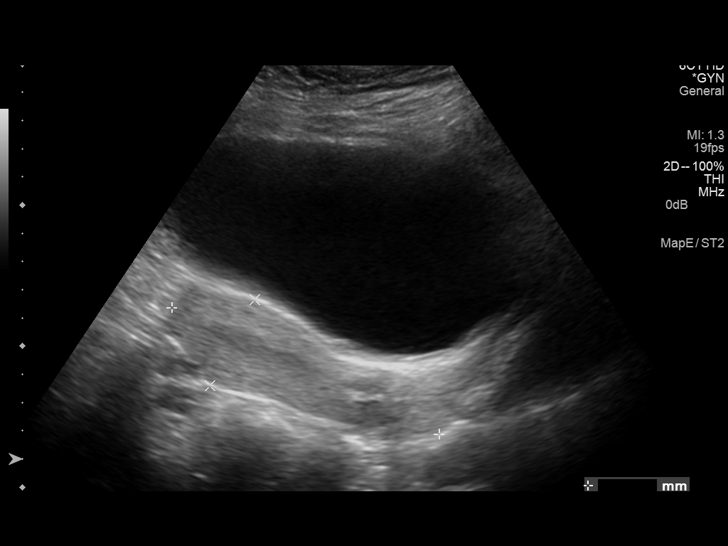
[im 7/77]
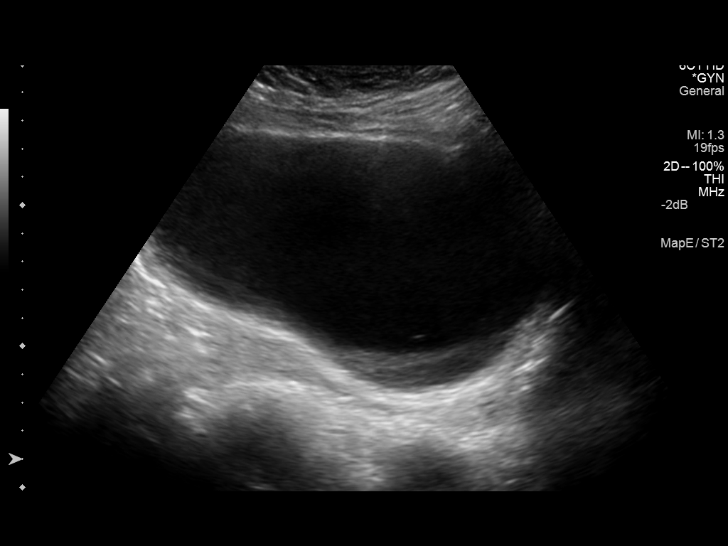
[im 13/77]
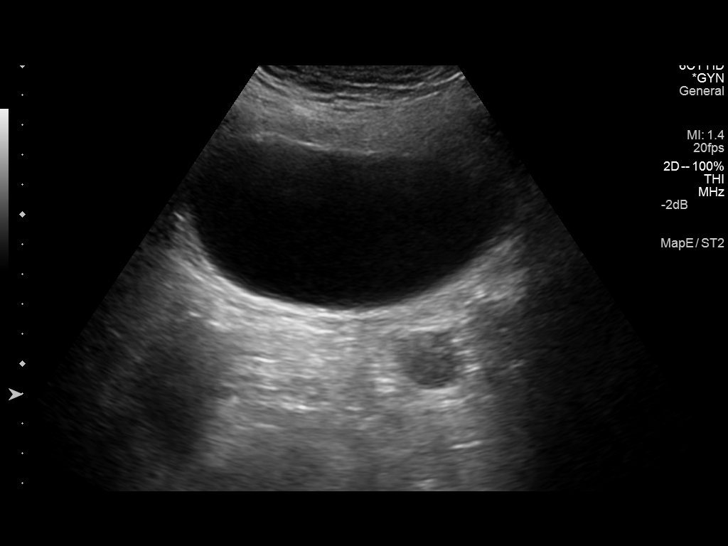
[im 20/77]
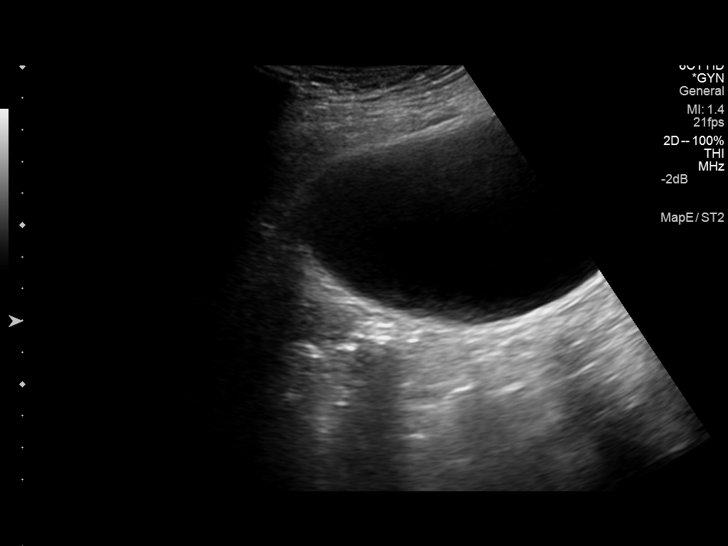
[im 26/77]
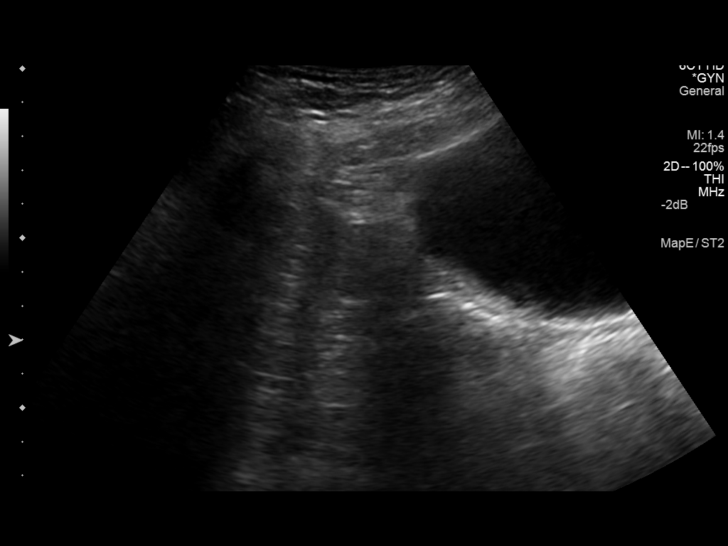
[im 29/77]
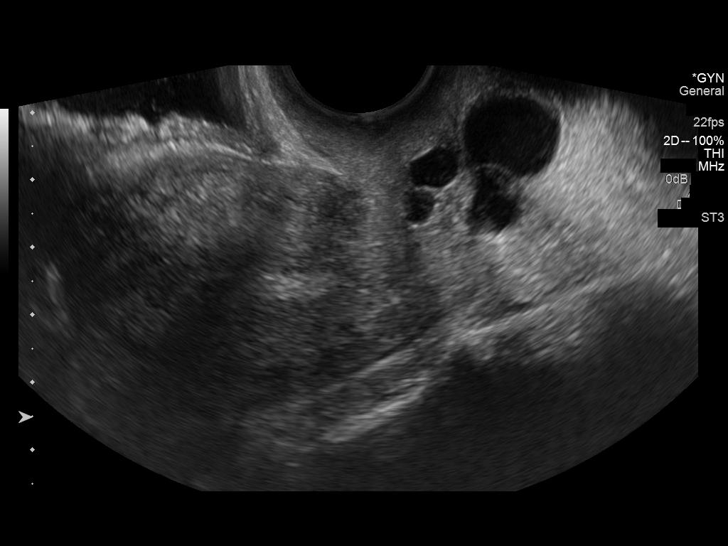
[im 35/77]
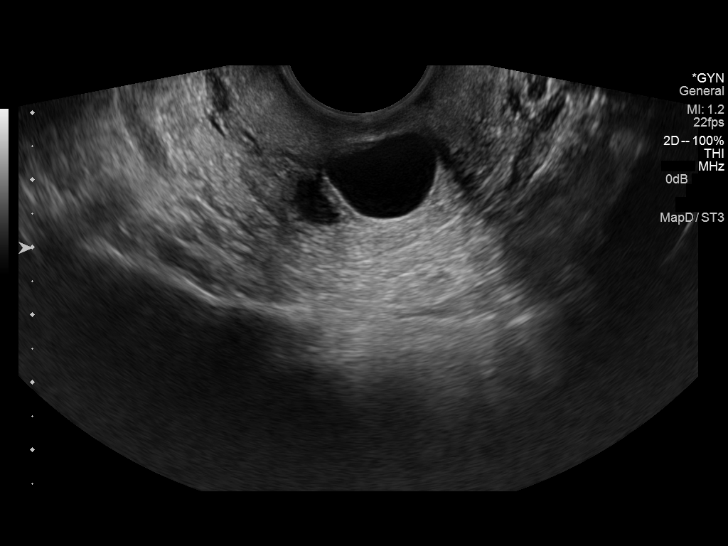
[im 42/77]
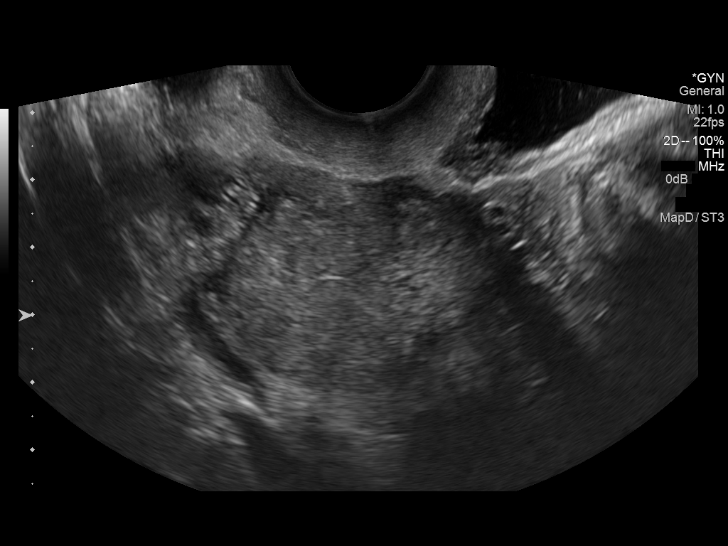
[im 48/77]
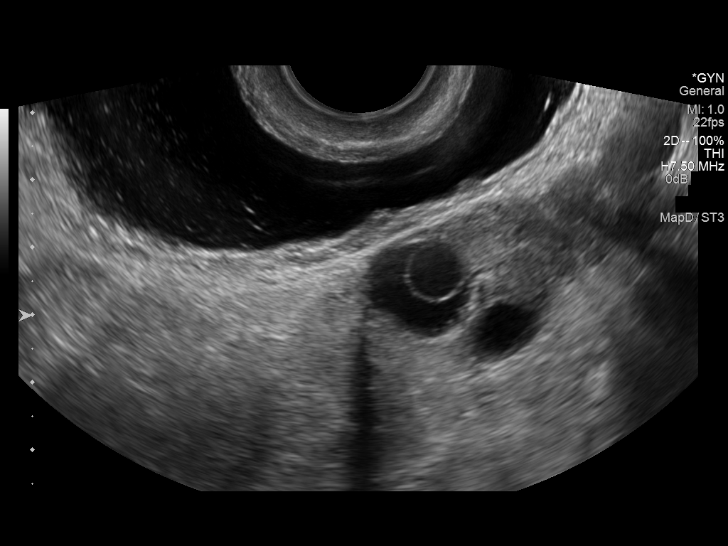
[im 51/77]
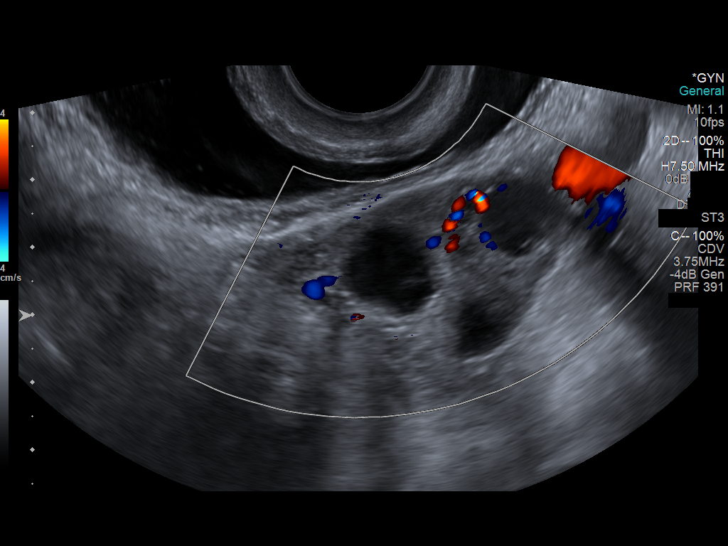
[im 58/77]
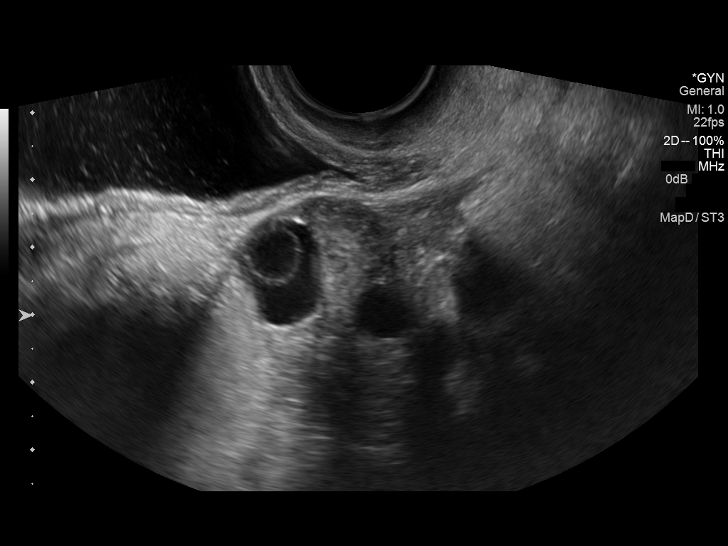
[im 64/77]
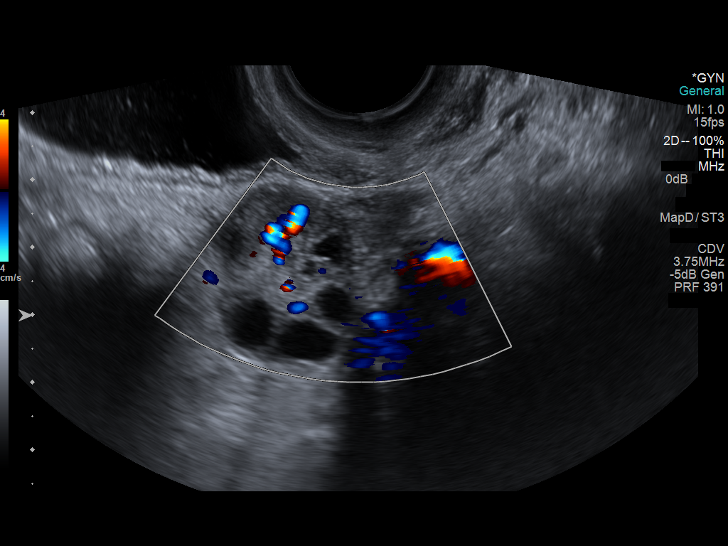
[im 70/77]
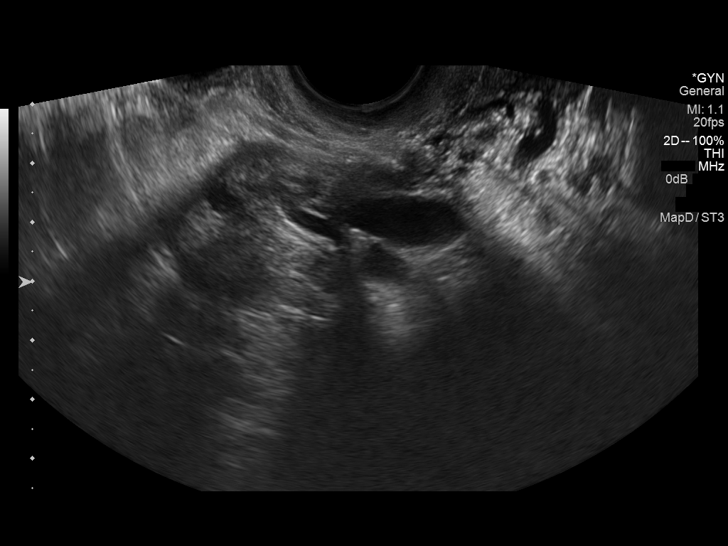
[im 77/77]
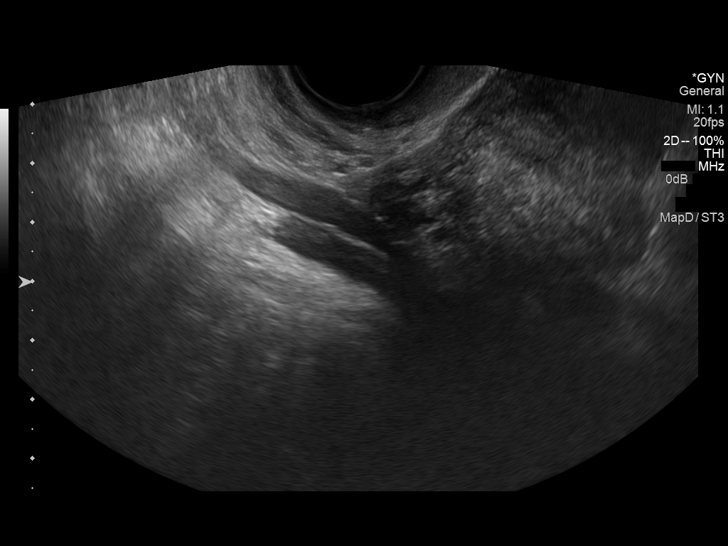

[14 of 25 positions shown; findings below may reference images not displayed]

FINDINGS: Uterus

Measurements: 10.5 x 3.4 x 5.0 cm. No fibroids are identified. There
are multiple cervical nabothian cysts.

Endometrium

Thickness: 4.0 mm.  No focal abnormality visualized.

Right ovary

Measurements: 2.9 x 2.0 x 1.9 cm. Normal appearance/no adnexal mass.

Left ovary

Measurements: 2.7 x 2.7 x 2.7 cm. Small collapsing follicle is
noted. There is no suspicious adnexal finding.

Other findings

No free fluid.
IMPRESSION: No acute or significant findings identified. Cervical nabothian
cysts and collapsing left ovarian follicle are noted.

## 2016-06-05 IMAGING — CR DG CHEST 2V
2 series · 2 of 2 positions shown · non-contrast
Comparison: None.

CLINICAL DATA: Fever weakness and dry cough for 3 months

EXAM:
CHEST  2 VIEW

[w chest pa]
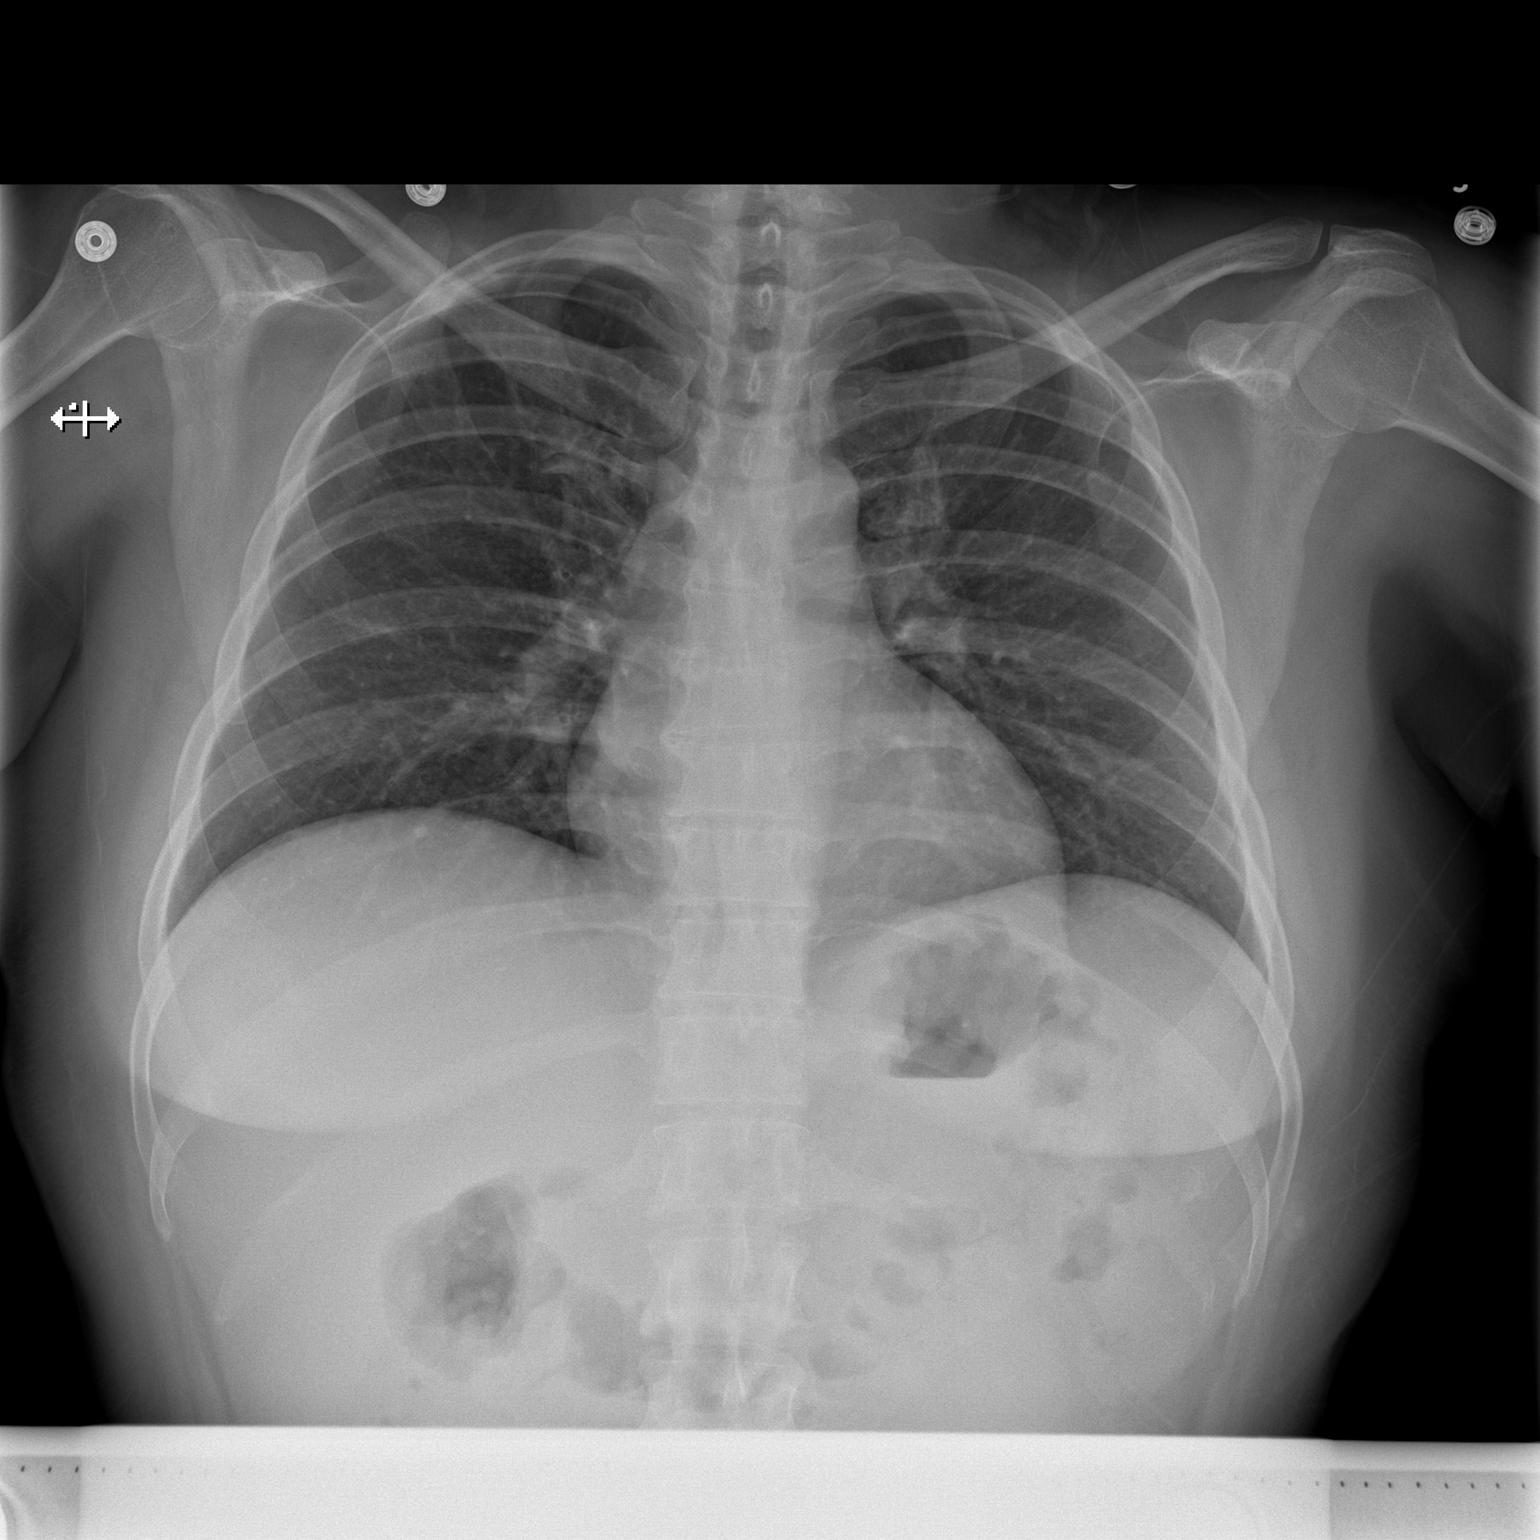

[w chest lat]
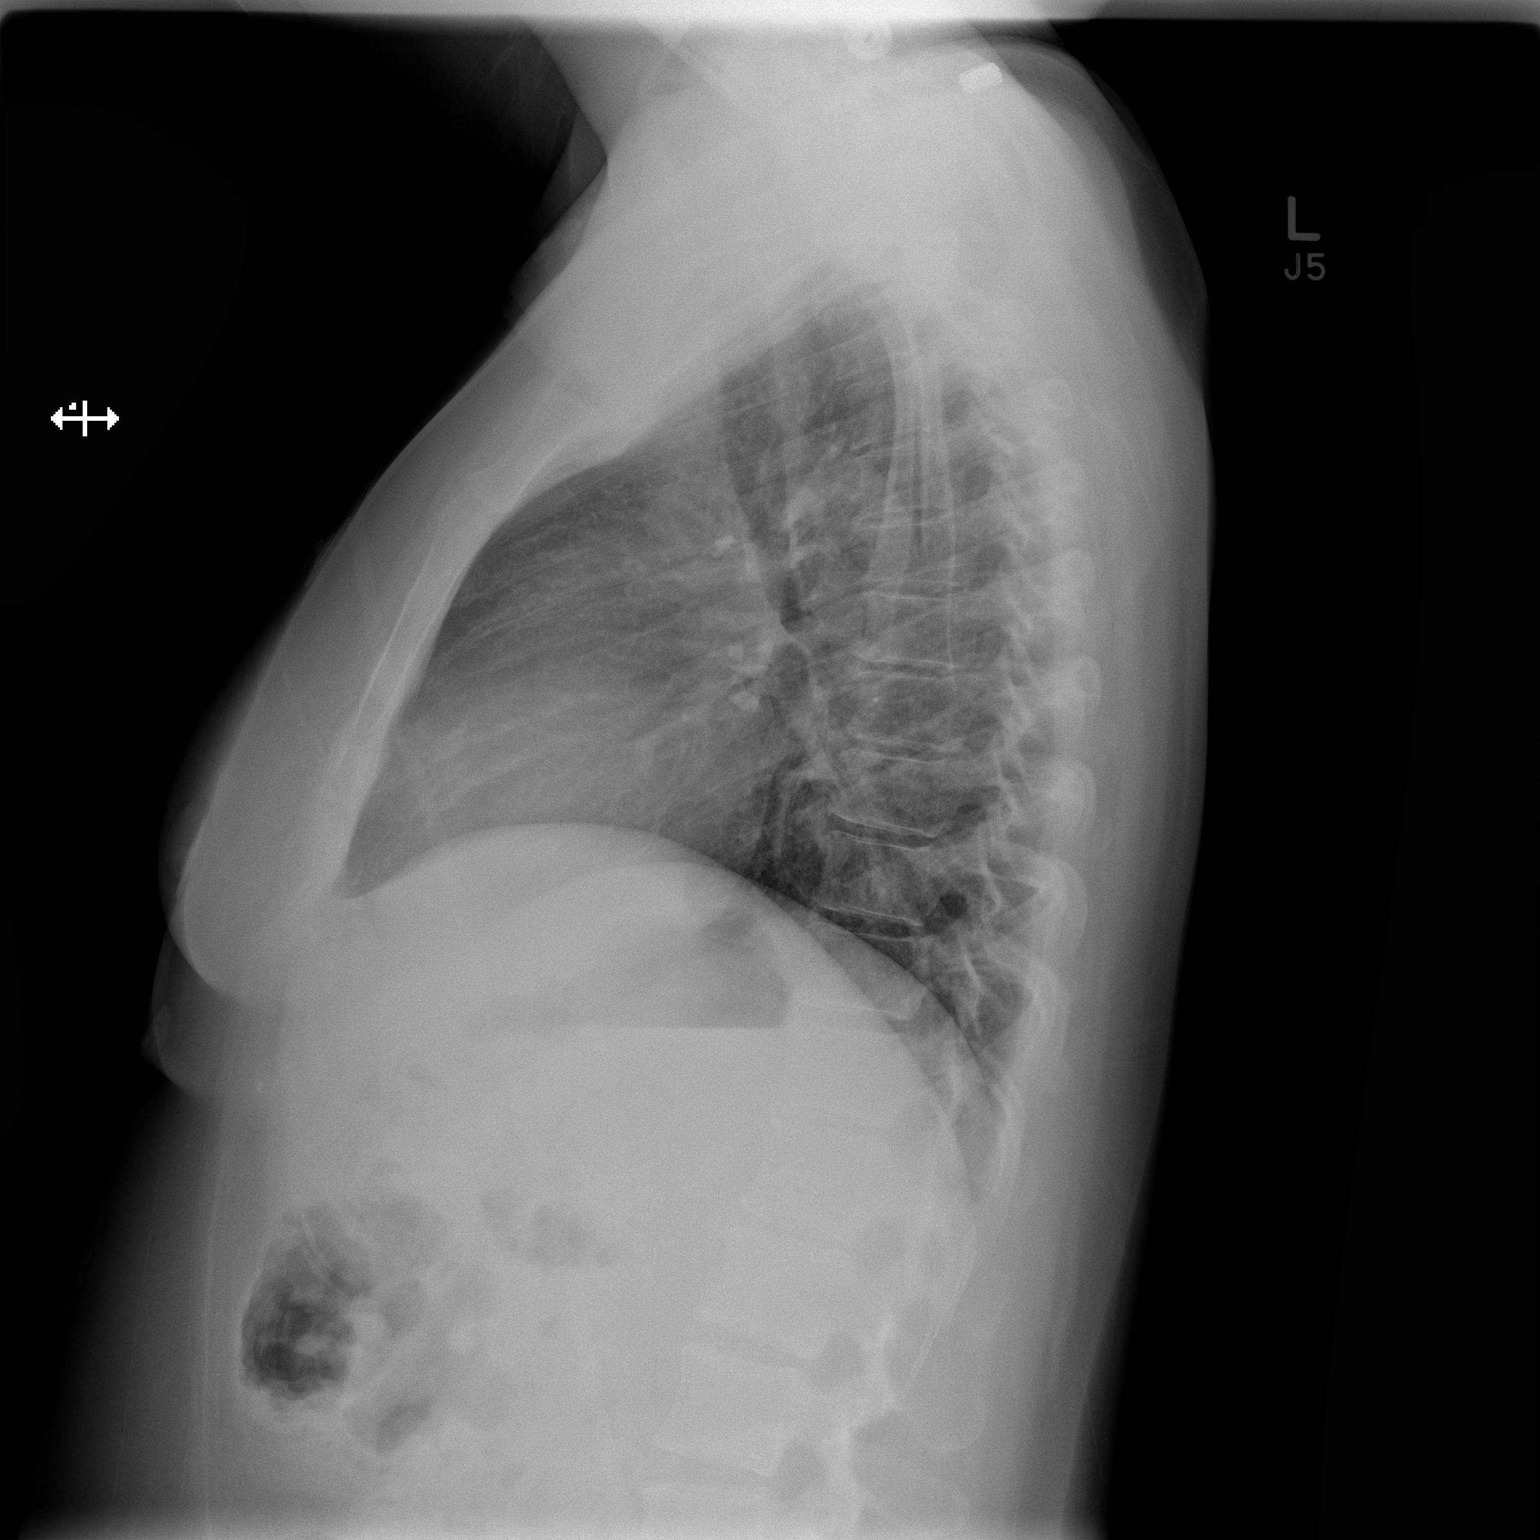

[2 of 2 positions shown; findings below may reference images not displayed]

FINDINGS: The heart size and mediastinal contours are within normal limits.
Both lungs are clear. The visualized skeletal structures are
unremarkable.
IMPRESSION: No active cardiopulmonary disease.
